# Patient Record
Sex: Male | Born: 1960 | Race: White | Hispanic: No | Marital: Married | Smoking: Current every day smoker
Health system: Southern US, Community
[De-identification: ages and names within clinical notes are randomized; demographics above are authoritative.]

## PROBLEM LIST (undated history)

## (undated) DIAGNOSIS — K219 Gastro-esophageal reflux disease without esophagitis: Secondary | ICD-10-CM

## (undated) DIAGNOSIS — E119 Type 2 diabetes mellitus without complications: Secondary | ICD-10-CM

## (undated) DIAGNOSIS — T7840XA Allergy, unspecified, initial encounter: Secondary | ICD-10-CM

## (undated) DIAGNOSIS — E785 Hyperlipidemia, unspecified: Secondary | ICD-10-CM

## (undated) HISTORY — DX: Type 2 diabetes mellitus without complications: E11.9

## (undated) HISTORY — DX: Gastro-esophageal reflux disease without esophagitis: K21.9

## (undated) HISTORY — DX: Hyperlipidemia, unspecified: E78.5

## (undated) HISTORY — PX: OTHER SURGICAL HISTORY: SHX169

## (undated) HISTORY — DX: Allergy, unspecified, initial encounter: T78.40XA

---

## 2016-03-28 ENCOUNTER — Encounter: Payer: Self-pay | Admitting: Gastroenterology

## 2016-05-07 ENCOUNTER — Ambulatory Visit (AMBULATORY_SURGERY_CENTER): Payer: Self-pay

## 2016-05-07 ENCOUNTER — Encounter: Payer: Self-pay | Admitting: Gastroenterology

## 2016-05-07 VITALS — Ht 68.0 in | Wt 199.6 lb

## 2016-05-07 DIAGNOSIS — Z1211 Encounter for screening for malignant neoplasm of colon: Secondary | ICD-10-CM

## 2016-05-07 MED ORDER — NA SULFATE-K SULFATE-MG SULF 17.5-3.13-1.6 GM/177ML PO SOLN: 1.0000 | Freq: Once | ORAL | 0 refills | Status: AC

## 2016-05-07 NOTE — Progress Notes (Signed)
Denies allergies to eggs or soy products. Denies complication of anesthesia or sedation. Denies use of weight loss medication. Denies use of O2.   Emmi instructions declined by patient.  

## 2016-05-24 ENCOUNTER — Encounter: Payer: Self-pay | Admitting: Gastroenterology

## 2016-05-24 ENCOUNTER — Ambulatory Visit (AMBULATORY_SURGERY_CENTER): Payer: BC Managed Care – PPO | Admitting: Gastroenterology

## 2016-05-24 VITALS — BP 93/58 | HR 69 | Temp 96.6°F | Resp 14 | Ht 68.0 in | Wt 199.0 lb

## 2016-05-24 DIAGNOSIS — D123 Benign neoplasm of transverse colon: Secondary | ICD-10-CM

## 2016-05-24 DIAGNOSIS — Z1211 Encounter for screening for malignant neoplasm of colon: Secondary | ICD-10-CM | POA: Diagnosis present

## 2016-05-24 DIAGNOSIS — Z1212 Encounter for screening for malignant neoplasm of rectum: Secondary | ICD-10-CM

## 2016-05-24 DIAGNOSIS — D125 Benign neoplasm of sigmoid colon: Secondary | ICD-10-CM

## 2016-05-24 DIAGNOSIS — K635 Polyp of colon: Secondary | ICD-10-CM

## 2016-05-24 DIAGNOSIS — D128 Benign neoplasm of rectum: Secondary | ICD-10-CM

## 2016-05-24 DIAGNOSIS — K621 Rectal polyp: Secondary | ICD-10-CM | POA: Diagnosis not present

## 2016-05-24 DIAGNOSIS — D126 Benign neoplasm of colon, unspecified: Secondary | ICD-10-CM | POA: Diagnosis not present

## 2016-05-24 DIAGNOSIS — D129 Benign neoplasm of anus and anal canal: Secondary | ICD-10-CM

## 2016-05-24 DIAGNOSIS — D124 Benign neoplasm of descending colon: Secondary | ICD-10-CM | POA: Diagnosis not present

## 2016-05-24 MED ORDER — SODIUM CHLORIDE 0.9 % IV SOLN: 500.0000 mL | INTRAVENOUS | Status: DC

## 2016-05-24 NOTE — Progress Notes (Signed)
Report given to PACU, vss 

## 2016-05-24 NOTE — Patient Instructions (Signed)
YOU HAD AN ENDOSCOPIC PROCEDURE TODAY AT THE [facility]:   Refer to the procedure report that was given to you for any specific questions about what was found during the examination.  If the procedure report does not answer your questions, please call your gastroenterologist to clarify.  If you requested that your care partner not be given the details of your procedure findings, then the procedure report has been included in a sealed envelope for you to review at your convenience later.  YOU SHOULD EXPECT: Some feelings of bloating in the abdomen. Passage of more gas than usual.  Walking can help get rid of the air that was put into your GI tract during the procedure and reduce the bloating. If you had a lower endoscopy (such as a colonoscopy or flexible sigmoidoscopy) you may notice spotting of blood in your stool or on the toilet paper. If you underwent a bowel prep for your procedure, you may not have a normal bowel movement for a few days.  Please Note:  You might notice some irritation and congestion in your nose or some drainage.  This is from the oxygen used during your procedure.  There is no need for concern and it should clear up in a day or so.  SYMPTOMS TO REPORT IMMEDIATELY:   Following lower endoscopy (colonoscopy or flexible sigmoidoscopy):  Excessive amounts of blood in the stool  Significant tenderness or worsening of abdominal pains  Swelling of the abdomen that is new, acute  Fever of 100F or higher    For urgent or emergent issues, a gastroenterologist can be reached at any hour by calling [phone].   DIET:  We do recommend a small meal at first, but then you may proceed to your regular diet.  Drink plenty of fluids but you should avoid alcoholic beverages for 24 hours.  ACTIVITY:  You should plan to take it easy for the rest of today and you should NOT DRIVE or use heavy machinery until tomorrow (because of the sedation medicines used during the test).     FOLLOW UP: Our staff will call the number listed on your records the next business day following your procedure to check on you and address any questions or concerns that you may have regarding the information given to you following your procedure. If we do not reach you, we will leave a message.  However, if you are feeling well and you are not experiencing any problems, there is no need to return our call.  We will assume that you have returned to your regular daily activities without incident.  If any biopsies were taken you will be contacted by phone or by letter within the next 1-3 weeks.  Please call us at [phone] if you have not heard about the biopsies in 3 weeks.    SIGNATURES/CONFIDENTIALITY: You and/or your care partner have signed paperwork which will be entered into your electronic medical record.  These signatures attest to the fact that that the information above on your After Visit Summary has been reviewed and is understood.  Full responsibility of the confidentiality of this discharge information lies with you and/or your care-partner.   Resume medications. Information given on polyps and hemorrhoids. 

## 2016-05-24 NOTE — Progress Notes (Signed)
Pt's states no medical or surgical changes since previsit or office visit. 

## 2016-05-24 NOTE — Op Note (Signed)
[name] Procedure Date: 05/24/2016 10:30 AM MRN: [id number] Endoscopist: Ladene Artist , MD Age: 56 Referring MD:  Date of Birth: 10/10/1960 Gender: Male Account #: [id number] Procedure:                Colonoscopy Indications:              Screening for colorectal malignant neoplasm, This                            is the patient's first colonoscopy Medicines:                Monitored Anesthesia Care Procedure:                Pre-Anesthesia Assessment:                           - Prior to the procedure, a History and Physical                            was performed, and patient medications and                            allergies were reviewed. The patient's tolerance of                            previous anesthesia was also reviewed. The risks                            and benefits of the procedure and the sedation                            options and risks were discussed with the patient.                            All questions were answered, and informed consent                            was obtained. Prior Anticoagulants: The patient has                            taken no previous anticoagulant or antiplatelet                            agents. ASA Grade Assessment: II - A patient with                            mild systemic disease. After reviewing the risks                            and benefits, the patient was deemed in                            satisfactory condition to undergo the procedure.  After obtaining informed consent, the colonoscope                            was passed under direct vision. Throughout the                            procedure, the patient's blood pressure, pulse, and                            oxygen saturations were monitored continuously. The                            Colonoscope was introduced through the anus and                            advanced to the the cecum,  identified by                            appendiceal orifice and ileocecal valve. The                            ileocecal valve, appendiceal orifice, and rectum                            were photographed. The quality of the bowel                            preparation was adequate after extensive lavage and                            suctioning. The colonoscopy was performed without                            difficulty. The patient tolerated the procedure                            well. Scope In: 10:40:16 AM Scope Out: 11:04:55 AM Scope Withdrawal Time: 0 hours 22 minutes 38 seconds  Total Procedure Duration: 0 hours 24 minutes 39 seconds  Findings:                 The perianal and digital rectal examinations were                            normal.                           A 5 mm polyp was found in the descending colon. The                            polyp was sessile. The polyp was removed with a                            cold biopsy forceps. Resection and retrieval were  complete.                           12 sessile polyps were found in the rectum, sigmoid                            colon and transverse colon. The polyps were 5 to 8                            mm in size. These polyps were removed with a cold                            snare. Resection and retrieval were complete.                           Internal hemorrhoids were found during                            retroflexion. The hemorrhoids were small and Grade                            I (internal hemorrhoids that do not prolapse).                           The exam was otherwise without abnormality on                            direct and retroflexion views. Complications:            No immediate complications. Estimated blood loss:                            None. Estimated Blood Loss:     Estimated blood loss: none. Impression:               - One 5 mm polyp in the descending colon,  removed                            with a cold biopsy forceps. Resected and retrieved.                           - Twelve 5 to 8 mm polyps in the rectum (7), in the                            sigmoid colon (2) and in the transverse colon (3),                            removed with a cold snare. Resected and retrieved.                           - Internal hemorrhoids.                           - The examination was otherwise normal on direct  and retroflexion views. Recommendation:           - Repeat colonoscopy in 2 - 5 years for                            surveillance, pending pathology review, with a more                            extensive bowel prep.                           - Patient has a contact number available for                            emergencies. The signs and symptoms of potential                            delayed complications were discussed with the                            patient. Return to normal activities tomorrow.                            Written discharge instructions were provided to the                            patient.                           - Resume previous diet.                           - Continue present medications.                           - Await pathology results. Ladene Artist, MD 05/24/2016 11:09:49 AM This report has been signed electronically.

## 2016-05-24 NOTE — Progress Notes (Signed)
Called to room to assist during endoscopic procedure.  Patient ID and intended procedure confirmed with present staff. Received instructions for my participation in the procedure from the performing physician.  

## 2016-05-25 ENCOUNTER — Telehealth: Payer: Self-pay

## 2016-05-25 NOTE — Telephone Encounter (Signed)
  Follow up Call-  Call back number 05/24/2016  Post procedure Call Back phone  # [phone]  Permission to leave phone message Yes     Patient questions:  Do you have a fever, pain , or abdominal swelling? No. Pain Score  0 *  Have you tolerated food without any problems? Yes.    Have you been able to return to your normal activities? Yes.    Do you have any questions about your discharge instructions: Diet   No. Medications  No. Follow up visit  No.  Do you have questions or concerns about your Care? No.  Actions: * If pain score is 4 or above: No action needed, pain <4.

## 2016-06-07 ENCOUNTER — Encounter: Payer: Self-pay | Admitting: Gastroenterology

## 2016-12-17 ENCOUNTER — Ambulatory Visit (INDEPENDENT_AMBULATORY_CARE_PROVIDER_SITE_OTHER): Payer: BC Managed Care – PPO | Admitting: Family Medicine

## 2016-12-17 ENCOUNTER — Encounter: Payer: Self-pay | Admitting: Family Medicine

## 2016-12-17 DIAGNOSIS — IMO0001 Reserved for inherently not codable concepts without codable children: Secondary | ICD-10-CM

## 2016-12-17 DIAGNOSIS — E119 Type 2 diabetes mellitus without complications: Secondary | ICD-10-CM | POA: Insufficient documentation

## 2016-12-17 DIAGNOSIS — E1165 Type 2 diabetes mellitus with hyperglycemia: Secondary | ICD-10-CM | POA: Insufficient documentation

## 2016-12-17 MED ORDER — DAPAGLIFLOZIN PROPANEDIOL 10 MG PO TABS
10.0000 mg | ORAL_TABLET | Freq: Every day | ORAL | 3 refills | Status: DC
Start: 2016-12-17 — End: 2017-02-27

## 2016-12-17 NOTE — Progress Notes (Signed)
Pre visit review using our clinic review tool, if applicable. No additional management support is needed unless otherwise documented below in the visit note. 

## 2016-12-17 NOTE — Patient Instructions (Addendum)
Aim to do some physical exertion for 150 minutes per week. This is typically divided into 5 days per week, 30 minutes per day. The activity should be enough to get your heart rate up. Anything is better than nothing if you have time constraints.  Give [place] 2-3 business days to get the results of your labs back. If labs are normal, you will likely receive a letter in the mail unless you have MyChart. This can take longer than 2-3 business days.   Healthy Eating Plan Many factors influence your heart health, including eating and exercise habits. Heart (coronary) risk increases with abnormal blood fat (lipid) levels. Heart-healthy meal planning includes limiting unhealthy fats, increasing healthy fats, and making other small dietary changes. This includes maintaining a healthy body weight to help keep lipid levels within a normal range.  WHAT IS MY PLAN?  Your health care provider recommends that you:  Drink a glass of water before meals to help with satiety.  Eat slowly.  An alternative to the water is to add Metamucil. This will help with satiety as well. It does contain calories, unlike water.  WHAT TYPES OF FAT SHOULD I CHOOSE?  Choose healthy fats more often. Choose monounsaturated and polyunsaturated fats, such as olive oil and canola oil, flaxseeds, walnuts, almonds, and seeds.  Eat more omega-3 fats. Good choices include salmon, mackerel, sardines, tuna, flaxseed oil, and ground flaxseeds. Aim to eat fish at least two times each week.  Avoid foods with partially hydrogenated oils in them. These contain trans fats. Examples of foods that contain trans fats are stick margarine, some tub margarines, cookies, crackers, and other baked goods. If you are going to avoid a fat, this is the one to avoid!  WHAT GENERAL GUIDELINES DO I NEED TO FOLLOW?  Check food labels carefully to identify foods with trans fats. Avoid these types of options when possible.  Fill one half of your plate with  vegetables and green salads. Eat 4-5 servings of vegetables per day. A serving of vegetables equals 1 cup of raw leafy vegetables,  cup of raw or cooked cut-up vegetables, or  cup of vegetable juice.  Fill one fourth of your plate with whole grains. Look for the word "whole" as the first word in the ingredient list.  Fill one fourth of your plate with lean protein foods.  Eat 4-5 servings of fruit per day. A serving of fruit equals one medium whole fruit,  cup of dried fruit,  cup of fresh, frozen, or canned fruit. Try to avoid fruits in cups/syrups as the sugar content can be high.  Eat more foods that contain soluble fiber. Examples of foods that contain this type of fiber are apples, broccoli, carrots, beans, peas, and barley. Aim to get 20-30 g of fiber per day.  Eat more home-cooked food and less restaurant, buffet, and fast food.  Limit or avoid alcohol.  Limit foods that are high in starch and sugar.  Avoid fried foods when able.  Cook foods by using methods other than frying. Baking, boiling, grilling, and broiling are all great options. Other fat-reducing suggestions include: ? Removing the skin from poultry. ? Removing all visible fats from meats. ? Skimming the fat off of stews, soups, and gravies before serving them. ? Steaming vegetables in water or broth.  Lose weight if you are overweight. Losing just 5-10% of your initial body weight can help your overall health and prevent diseases such as diabetes and heart disease.  Increase your  consumption of nuts, legumes, and seeds to 4-5 servings per week. One serving of dried beans or legumes equals  cup after being cooked, one serving of nuts equals 1 ounces, and one serving of seeds equals  ounce or 1 tablespoon.  WHAT ARE GOOD FOODS CAN I EAT? Grains Grainy breads (try to find bread that is 3 g of fiber per slice or greater), oatmeal, light popcorn. Whole-grain cereals. Rice and pasta, including brown rice and those  that are made with whole wheat. Edamame pasta is a great alternative to grain pasta. It has a higher protein content. Try to avoid significant consumption of white bread, sugary cereals, or pastries/baked goods.  Vegetables All vegetables. Cooked white potatoes do not count as vegetables.  Fruits All fruits, but limit pineapple and bananas as these fruits have a higher sugar content.  Meats and Other Protein Sources Lean, well-trimmed beef, veal, pork, and lamb. Chicken and [place] without skin. All fish and shellfish. Wild duck, rabbit, pheasant, and venison. Egg whites or low-cholesterol egg substitutes. Dried beans, peas, lentils, and tofu.Seeds and most nuts.  Dairy Low-fat or nonfat cheeses, including ricotta, string, and mozzarella. Skim or 1% milk that is liquid, powdered, or evaporated. Buttermilk that is made with low-fat milk. Nonfat or low-fat yogurt. Soy/Almond milk are good alternatives if you cannot handle dairy.  Beverages Water is the best for you. Sports drinks with less sugar are more desirable unless you are a highly active athlete.  Sweets and Desserts Sherbets and fruit ices. Honey, jam, marmalade, jelly, and syrups. Dark chocolate.  Eat all sweets and desserts in moderation.  Fats and Oils Nonhydrogenated (trans-free) margarines. Vegetable oils, including soybean, sesame, sunflower, olive, peanut, safflower, corn, canola, and cottonseed. Salad dressings or mayonnaise that are made with a vegetable oil. Limit added fats and oils that you use for cooking, baking, salads, and as spreads.  Other Cocoa powder. Coffee and tea. Most condiments.  The items listed above may not be a complete list of recommended foods or beverages. Contact your dietitian for more options.

## 2016-12-17 NOTE — Progress Notes (Signed)
Chief Complaint  Patient presents with  . Establish Care       New Patient Visit SUBJECTIVE: HPI: Dennis Stanton is an 56 y.o.male who is being seen for establishing care.  The patient was previously seen at [facility]. Here with wife.   Colonoscopy-2018 Pneumonia vaccine-2018 Flu shot-1 week ago Eye exam-[facility]/03/[facility] Microalbumin/creatinine ratio- 08/31/16 Last A1c was 7.2-7/13/18  He checks his sugar around 2 times per week.  Readings are low 100s fasting.  He is currently on Farxiga 5 mg daily, Januvia 100 mg daily, he does not require insulin.  He denies hypoglycemia.  He is physically active at work, however he dedicates no time to exercise.  His diet is fair.  He is on Lipitor 20 mg daily.  He is also on lisinopril.  No Known Allergies  Past Medical History:  Diagnosis Date  . Allergy   . Diabetes mellitus without complication ([place])   . GERD (gastroesophageal reflux disease)   . Hyperlipidemia    Past Surgical History:  Procedure Laterality Date  . broken elbow Right    Social History   Social History  . Marital status: Married   Social History Main Topics  . Smoking status: Current Every Day Smoker    Packs/day: 0.50    Types: Cigarettes  . Smokeless tobacco: Never Used  . Alcohol use Yes     Comment: occasional  . Drug use: No   Family History  Problem Relation Age of Onset  . Bladder Cancer Father   . Diabetes Mother   . Heart disease Mother   . Hypertension Mother   . Diabetes Sister   . Colon cancer Neg Hx   . Esophageal cancer Neg Hx   . Rectal cancer Neg Hx   . Stomach cancer Neg Hx      Current Outpatient Prescriptions:  .  aspirin 81 MG chewable tablet, Chew 1 mg by mouth daily., Disp: , Rfl:  .  atorvastatin (LIPITOR) 20 MG tablet, Take 20 mg by mouth daily., Disp: , Rfl:  .  dapagliflozin propanediol (FARXIGA) 5 MG TABS tablet, Take 5 mg by mouth daily., Disp: , Rfl:  .  glucose blood (ONE TOUCH ULTRA TEST) test strip,  Test as directed three times daily, Disp: , Rfl:  .  lisinopril (PRINIVIL,ZESTRIL) 10 MG tablet, Take 10 mg by mouth daily., Disp: , Rfl:  .  metFORMIN (GLUCOPHAGE) 1000 MG tablet, Take 1,000 mg by mouth 2 (two) times daily with a meal., Disp: , Rfl:  .  Omega-3 Fatty Acids (FISH OIL) 1000 MG CAPS, Take 1 capsule by mouth daily., Disp: , Rfl:  .  sitaGLIPtin (JANUVIA) 100 MG tablet, Take 100 mg by mouth daily., Disp: , Rfl:   ROS Cardiovascular: Denies chest pain  Respiratory: Denies dyspnea   OBJECTIVE: BP 108/62 (BP Location: Left Arm, Patient Position: Sitting, Cuff Size: Normal)   Pulse 88   Temp 98.4 F (36.9 C) (Oral)   Ht 5\' 8"  (1.727 m)   Wt 197 lb (89.4 kg)   SpO2 97%   BMI 29.95 kg/m   Constitutional: -  VS reviewed -  Well developed, well nourished, appears stated age -  No apparent distress  Psychiatric: -  Oriented to person, place, and time -  Memory intact -  Affect and mood normal -  Fluent conversation, good eye contact -  Judgment and insight age appropriate  Eye: -  Conjunctivae clear, no discharge -  Pupils symmetric, round, reactive  to light  ENMT: -  MMM    Pharynx moist, no exudate, no erythema  Neck: -  No gross swelling, no palpable masses -  Thyroid midline, not enlarged, mobile, no palpable masses  Cardiovascular: -  RRR -  No LE edema  Respiratory: -  Normal respiratory effort, no accessory muscle use, no retraction -  Breath sounds equal, no wheezes, no ronchi, no crackles  Gastrointestinal: -  Bowel sounds normal -  No tenderness, no distention, no guarding, no masses  Neurological:  -  CN II - XII grossly intact -  Sensation grossly intact to light touch, equal bilaterally  Musculoskeletal: -  No clubbing, no cyanosis -  Gait normal  Skin: -  No significant lesion on inspection -  Warm and dry to palpation   ASSESSMENT/PLAN: Uncontrolled diabetes mellitus type 2 without complications (HCC) - Plan: Hemoglobin A1c, dapagliflozin  propanediol (FARXIGA) 10 MG TABS tablet  Patient instructed to sign release of records form from his previous PCP. Counseled on diet and exercise.  Healthy diet handout given.  Due to cost, I will stop Januvia.  Fortunately for discontinuation, this medicine lacks mortality benefit data.  We will increase the dose of the first week.  Continue metformin. Patient should return 3 mo. The patient voiced understanding and agreement to the plan.   [place], DO 12/17/16  4:10 PM

## 2016-12-21 LAB — HEMOGLOBIN A1C: HEMOGLOBIN A1C: 7 % — AB (ref 4.6–6.5)

## 2017-01-17 ENCOUNTER — Telehealth: Payer: Self-pay | Admitting: *Deleted

## 2017-02-27 ENCOUNTER — Telehealth: Payer: Self-pay | Admitting: Family Medicine

## 2017-02-27 DIAGNOSIS — E1165 Type 2 diabetes mellitus with hyperglycemia: Principal | ICD-10-CM

## 2017-02-27 DIAGNOSIS — IMO0001 Reserved for inherently not codable concepts without codable children: Secondary | ICD-10-CM

## 2017-02-27 MED ORDER — DAPAGLIFLOZIN PROPANEDIOL 10 MG PO TABS: 10.0000 mg | ORAL_TABLET | Freq: Every day | ORAL | 1 refills | Status: DC

## 2017-02-27 MED ORDER — LISINOPRIL 10 MG PO TABS: 10.0000 mg | ORAL_TABLET | Freq: Every day | ORAL | 1 refills | Status: DC

## 2017-02-27 MED ORDER — METFORMIN HCL 1000 MG PO TABS: 1000.0000 mg | ORAL_TABLET | Freq: Two times a day (BID) | ORAL | 1 refills | Status: DC

## 2017-02-27 NOTE — Telephone Encounter (Signed)
Copied from [place] [id number]. Topic: Quick Communication - See Telephone Encounter >> Feb 27, 2017 10:46 AM Conception Chancy, NT wrote: CRM for notification. See Telephone encounter for:  02/27/17.  Patient is requesting a refill on metformin 1000mg , lisinopril 10mg , atorvastatin 20mg , farxiga 10mg  . All 90 day supply be sent into [facility] on file.

## 2017-02-28 ENCOUNTER — Telehealth: Payer: Self-pay | Admitting: Family Medicine

## 2017-02-28 NOTE — Telephone Encounter (Signed)
Copied from [place] [id number]. Topic: Inquiry >> Feb 28, 2017  5:08 PM Cecelia Byars, NT wrote: Reason for QTT:[id number] wife and called and said the pharmacy called and said that the prescription for atorvastin 20 mg will not be filled until the patient is seen , he has an appointment on 03/21/17 ,he would also like this to be filled for 90 days please advise  [phone]

## 2017-03-01 MED ORDER — ATORVASTATIN CALCIUM 20 MG PO TABS: 20.0000 mg | ORAL_TABLET | Freq: Every day | ORAL | 0 refills | Status: DC

## 2017-03-21 ENCOUNTER — Ambulatory Visit: Payer: BC Managed Care – PPO | Admitting: Family Medicine

## 2017-04-08 ENCOUNTER — Ambulatory Visit: Payer: BC Managed Care – PPO | Admitting: Family Medicine

## 2017-04-08 DIAGNOSIS — Z0289 Encounter for other administrative examinations: Secondary | ICD-10-CM

## 2017-05-30 ENCOUNTER — Other Ambulatory Visit: Payer: Self-pay | Admitting: Family Medicine

## 2017-08-23 ENCOUNTER — Ambulatory Visit: Payer: BC Managed Care – PPO | Admitting: Family Medicine

## 2017-08-23 ENCOUNTER — Encounter: Payer: Self-pay | Admitting: Family Medicine

## 2017-08-23 VITALS — BP 120/78 | HR 78 | Temp 97.7°F | Resp 98 | Ht 68.0 in | Wt 182.0 lb

## 2017-08-23 DIAGNOSIS — Z114 Encounter for screening for human immunodeficiency virus [HIV]: Secondary | ICD-10-CM

## 2017-08-23 DIAGNOSIS — E785 Hyperlipidemia, unspecified: Secondary | ICD-10-CM | POA: Diagnosis not present

## 2017-08-23 DIAGNOSIS — Z125 Encounter for screening for malignant neoplasm of prostate: Secondary | ICD-10-CM | POA: Diagnosis not present

## 2017-08-23 DIAGNOSIS — Z1159 Encounter for screening for other viral diseases: Secondary | ICD-10-CM

## 2017-08-23 DIAGNOSIS — IMO0001 Reserved for inherently not codable concepts without codable children: Secondary | ICD-10-CM

## 2017-08-23 DIAGNOSIS — E1165 Type 2 diabetes mellitus with hyperglycemia: Secondary | ICD-10-CM

## 2017-08-23 LAB — COMPREHENSIVE METABOLIC PANEL
ALK PHOS: 53 U/L (ref 39–117)
ALT: 26 U/L (ref 0–53)
AST: 17 U/L (ref 0–37)
Albumin: 4.7 g/dL (ref 3.5–5.2)
BILIRUBIN TOTAL: 0.8 mg/dL (ref 0.2–1.2)
BUN: 16 mg/dL (ref 6–23)
CO2: 27 meq/L (ref 19–32)
Calcium: 9.8 mg/dL (ref 8.4–10.5)
Chloride: 102 mEq/L (ref 96–112)
Creatinine, Ser: 0.74 mg/dL (ref 0.40–1.50)
GFR: 115.1 mL/min (ref >60.00)
GLUCOSE: 171 mg/dL — AB (ref 70–99)
Potassium: 4.3 mEq/L (ref 3.5–5.1)
SODIUM: 139 meq/L (ref 135–145)
TOTAL PROTEIN: 7.1 g/dL (ref 6.0–8.3)

## 2017-08-23 LAB — LIPID PANEL
CHOLESTEROL: 97 mg/dL (ref 0–200)
HDL: 34.9 mg/dL — ABNORMAL LOW (ref >39.00)
LDL Cholesterol: 47 mg/dL (ref 0–99)
NONHDL: 62.29
Total CHOL/HDL Ratio: 3
Triglycerides: 77 mg/dL (ref 0.0–149.0)
VLDL: 15.4 mg/dL (ref 0.0–40.0)

## 2017-08-23 LAB — PSA: PSA: 0.09 ng/mL — ABNORMAL LOW (ref 0.10–4.00)

## 2017-08-23 LAB — HEMOGLOBIN A1C: HEMOGLOBIN A1C: 7.6 % — AB (ref 4.6–6.5)

## 2017-08-23 MED ORDER — ATORVASTATIN CALCIUM 20 MG PO TABS: 20.0000 mg | ORAL_TABLET | Freq: Every day | ORAL | 3 refills | Status: DC

## 2017-08-23 MED ORDER — METFORMIN HCL 1000 MG PO TABS: 1000.0000 mg | ORAL_TABLET | Freq: Two times a day (BID) | ORAL | 1 refills | Status: DC

## 2017-08-23 MED ORDER — LISINOPRIL 10 MG PO TABS: 10.0000 mg | ORAL_TABLET | Freq: Every day | ORAL | 1 refills | Status: DC

## 2017-08-23 MED ORDER — DAPAGLIFLOZIN PROPANEDIOL 10 MG PO TABS: 10.0000 mg | ORAL_TABLET | Freq: Every day | ORAL | 1 refills | Status: DC

## 2017-08-23 NOTE — Progress Notes (Signed)
Pre visit review using our clinic review tool, if applicable. No additional management support is needed unless otherwise documented below in the visit note. 

## 2017-08-23 NOTE — Patient Instructions (Addendum)
Call your pharmacy to see about the availability of the new shingles vaccine (Shingrix).  Keep the diet clean.  Stay physically active.   Have your eye provider send [place] your report please.  Let us know if you need anything.

## 2017-08-23 NOTE — Progress Notes (Signed)
Subjective:   Chief Complaint  Patient presents with  . Follow-up    diabetes    Dennis Stanton is a 57 y.o. male here for follow-up of diabetes.   Dennis Stanton's self monitored glucose range is low 100's.  Patient denies hypoglycemic reactions. He checks his glucose levels 2 times per day. Patient does not require insulin.   Medications include: Farxiga 10 mg/d and Metformin 1000 mg bid Reports diet is healthy overall. Patient exercises 7 days per week on average.  He will walk or do work in his garden. Eye exam is coming up.   Hyperlipidemia Patient presents for dyslipidemia follow up. Currently being treated with Lipitor 20 mg/d and compliance with treatment thus far has been good. He denies myalgias. He is adhering to a healthy. The patient exercises daily.  The patient is not known to have coexisting coronary artery disease.   Past Medical History:  Diagnosis Date  . Allergy   . Diabetes mellitus without complication ([place])   . GERD (gastroesophageal reflux disease)   . Hyperlipidemia     Past Surgical History:  Procedure Laterality Date  . broken elbow Right     Family History  Problem Relation Age of Onset  . Bladder Cancer Father   . Diabetes Mother   . Heart disease Mother   . Hypertension Mother   . Diabetes Sister   . Colon cancer Neg Hx   . Esophageal cancer Neg Hx   . Rectal cancer Neg Hx   . Stomach cancer Neg Hx    Current Outpatient Medications on File Prior to Visit  Medication Sig Dispense Refill  . aspirin 81 MG chewable tablet Chew 1 mg by mouth daily.    Marland Kitchen atorvastatin (LIPITOR) 20 MG tablet TAKE 1 TABLET BY MOUTH ONCE DAILY 90 tablet 0  . dapagliflozin propanediol (FARXIGA) 10 MG TABS tablet Take 10 mg by mouth daily. 90 tablet 1  . glucose blood (ONE TOUCH ULTRA TEST) test strip Test as directed three times daily    . lisinopril (PRINIVIL,ZESTRIL) 10 MG tablet Take 1 tablet (10 mg total) by mouth daily. 90 tablet 1  . metFORMIN (GLUCOPHAGE)  1000 MG tablet Take 1 tablet (1,000 mg total) by mouth 2 (two) times daily with a meal. 180 tablet 1  . Omega-3 Fatty Acids (FISH OIL) 1000 MG CAPS Take 1 capsule by mouth daily.      Related testing: Date of retinal exam: 08/2016 Done by:  Dr. Bing Plume Pneumovax: done 2 years ago Tetanus- 2 years  Review of Systems: Pulmonary:  No SOB Cardiovascular:  No chest pain  Objective:  BP 120/78 (BP Location: Left Arm, Patient Position: Sitting, Cuff Size: Normal)   Pulse 78   Temp 97.7 F (36.5 C) (Oral)   Resp (!) 98   Ht 5\' 8"  (1.727 m)   Wt 182 lb (82.6 kg)   BMI 27.67 kg/m  General:  Well developed, well nourished, in no apparent distress Skin:  Warm, no pallor or diaphoresis Head:  Normocephalic, atraumatic Eyes:  Pupils equal and round, sclera anicteric without injection  Lungs:  CTAB, no access msc use Cardio:  RRR, no bruits, no LE edema Musculoskeletal:  Symmetrical muscle groups noted without atrophy or deformity Neuro:  Sensation intact to pinprick on feet Psych: Age appropriate judgment and insight  Assessment:   Uncontrolled diabetes mellitus type 2 without complications (HCC) - Plan: Hemoglobin A1c, Comprehensive metabolic panel, dapagliflozin propanediol (FARXIGA) 10 MG TABS tablet, HM Diabetes Foot Exam  Hyperlipidemia,  unspecified hyperlipidemia type - Plan: Lipid panel  Screening for prostate cancer - Plan: PSA  Screening for HIV (human immunodeficiency virus) - Plan: HIV antibody  Encounter for hepatitis C screening test for low risk patient - Plan: Hepatitis C antibody   Plan:   Orders as above. Counseled on diet and exercise. Cont above meds for now.  Counseled on risks and benefits of prostate cancer screening with PSA, he elected to undergo screening. F/u in 3 mo. The patient voiced understanding and agreement to the plan.  [place], DO 08/23/17 12:46 PM

## 2017-08-24 LAB — HEPATITIS C ANTIBODY
Hepatitis C Ab: NONREACTIVE
SIGNAL TO CUT-OFF: 0.01 (ref <1.00)

## 2017-08-24 LAB — HIV ANTIBODY (ROUTINE TESTING W REFLEX): HIV: NONREACTIVE

## 2017-08-27 ENCOUNTER — Other Ambulatory Visit: Payer: Self-pay | Admitting: Family Medicine

## 2017-08-27 MED ORDER — PIOGLITAZONE HCL 30 MG PO TABS: 30.0000 mg | ORAL_TABLET | Freq: Every day | ORAL | 3 refills | Status: DC

## 2017-12-22 ENCOUNTER — Other Ambulatory Visit: Payer: Self-pay | Admitting: Family Medicine

## 2018-02-21 ENCOUNTER — Other Ambulatory Visit: Payer: Self-pay | Admitting: Family Medicine

## 2018-05-18 ENCOUNTER — Other Ambulatory Visit: Payer: Self-pay | Admitting: Family Medicine

## 2018-07-06 ENCOUNTER — Other Ambulatory Visit: Payer: Self-pay | Admitting: Family Medicine

## 2018-07-06 DIAGNOSIS — IMO0001 Reserved for inherently not codable concepts without codable children: Secondary | ICD-10-CM

## 2018-07-27 ENCOUNTER — Other Ambulatory Visit: Payer: Self-pay | Admitting: Family Medicine

## 2018-08-18 ENCOUNTER — Other Ambulatory Visit: Payer: Self-pay | Admitting: Family Medicine

## 2018-09-28 ENCOUNTER — Other Ambulatory Visit: Payer: Self-pay | Admitting: Family Medicine

## 2018-09-28 DIAGNOSIS — IMO0001 Reserved for inherently not codable concepts without codable children: Secondary | ICD-10-CM

## 2018-11-17 ENCOUNTER — Other Ambulatory Visit: Payer: Self-pay | Admitting: Family Medicine

## 2018-12-15 ENCOUNTER — Other Ambulatory Visit: Payer: Self-pay | Admitting: Family Medicine

## 2018-12-18 ENCOUNTER — Other Ambulatory Visit: Payer: Self-pay

## 2018-12-19 ENCOUNTER — Encounter: Payer: Self-pay | Admitting: Family Medicine

## 2018-12-19 ENCOUNTER — Other Ambulatory Visit: Payer: Self-pay | Admitting: *Deleted

## 2018-12-19 ENCOUNTER — Ambulatory Visit: Payer: BC Managed Care – PPO | Admitting: Family Medicine

## 2018-12-19 VITALS — BP 104/62 | HR 76 | Temp 96.1°F | Ht 69.0 in | Wt 187.0 lb

## 2018-12-19 DIAGNOSIS — E785 Hyperlipidemia, unspecified: Secondary | ICD-10-CM

## 2018-12-19 DIAGNOSIS — I10 Essential (primary) hypertension: Secondary | ICD-10-CM | POA: Diagnosis not present

## 2018-12-19 DIAGNOSIS — E1165 Type 2 diabetes mellitus with hyperglycemia: Secondary | ICD-10-CM

## 2018-12-19 DIAGNOSIS — E119 Type 2 diabetes mellitus without complications: Secondary | ICD-10-CM

## 2018-12-19 LAB — MICROALBUMIN / CREATININE URINE RATIO
Creatinine,U: 41.6 mg/dL
Microalb Creat Ratio: 1.7 mg/g (ref 0.0–30.0)
Microalb, Ur: <0.7 mg/dL (ref 0.0–1.9)

## 2018-12-19 LAB — LIPID PANEL
Cholesterol: 125 mg/dL (ref 0–200)
HDL: 37.8 mg/dL — ABNORMAL LOW (ref >39.00)
LDL Cholesterol: 67 mg/dL (ref 0–99)
NonHDL: 87.12
Total CHOL/HDL Ratio: 3
Triglycerides: 102 mg/dL (ref 0.0–149.0)
VLDL: 20.4 mg/dL (ref 0.0–40.0)

## 2018-12-19 LAB — COMPREHENSIVE METABOLIC PANEL
ALT: 28 U/L (ref 0–53)
AST: 17 U/L (ref 0–37)
Albumin: 4.6 g/dL (ref 3.5–5.2)
Alkaline Phosphatase: 60 U/L (ref 39–117)
BUN: 18 mg/dL (ref 6–23)
CO2: 27 mEq/L (ref 19–32)
Calcium: 9.8 mg/dL (ref 8.4–10.5)
Chloride: 102 mEq/L (ref 96–112)
Creatinine, Ser: 0.75 mg/dL (ref 0.40–1.50)
GFR: 106.15 mL/min (ref >60.00)
Glucose, Bld: 186 mg/dL — ABNORMAL HIGH (ref 70–99)
Potassium: 4.3 mEq/L (ref 3.5–5.1)
Sodium: 138 mEq/L (ref 135–145)
Total Bilirubin: 0.7 mg/dL (ref 0.2–1.2)
Total Protein: 7.1 g/dL (ref 6.0–8.3)

## 2018-12-19 LAB — CBC
HCT: 47.5 % (ref 39.0–52.0)
Hemoglobin: 16.3 g/dL (ref 13.0–17.0)
MCHC: 34.4 g/dL (ref 30.0–36.0)
MCV: 93.2 fl (ref 78.0–100.0)
Platelets: 124 10*3/uL — ABNORMAL LOW (ref 150.0–400.0)
RBC: 5.09 Mil/uL (ref 4.22–5.81)
RDW: 13.6 % (ref 11.5–15.5)
WBC: 7.4 10*3/uL (ref 4.0–10.5)

## 2018-12-19 LAB — HEMOGLOBIN A1C: Hgb A1c MFr Bld: 8.3 % — ABNORMAL HIGH (ref 4.6–6.5)

## 2018-12-19 MED ORDER — METFORMIN HCL 1000 MG PO TABS: ORAL_TABLET | ORAL | 0 refills | Status: DC

## 2018-12-19 MED ORDER — ATORVASTATIN CALCIUM 20 MG PO TABS: 20.0000 mg | ORAL_TABLET | Freq: Every day | ORAL | 1 refills | Status: DC

## 2018-12-19 MED ORDER — LISINOPRIL 10 MG PO TABS: ORAL_TABLET | ORAL | 1 refills | Status: DC

## 2018-12-19 MED ORDER — PIOGLITAZONE HCL 30 MG PO TABS: ORAL_TABLET | ORAL | 1 refills | Status: DC

## 2018-12-19 MED ORDER — FARXIGA 10 MG PO TABS: 10.0000 mg | ORAL_TABLET | Freq: Every day | ORAL | 1 refills | Status: DC

## 2018-12-19 NOTE — Patient Instructions (Signed)
Give [place] 2-3 business days to get the results of your labs back.   Keep the diet clean and stay active.  Aim to do some physical exertion for 150 minutes per week. This is typically divided into 5 days per week, 30 minutes per day. The activity should be enough to get your heart rate up. Anything is better than nothing if you have time constraints.  Call your eye provider.   Let us know if you need anything.

## 2018-12-19 NOTE — Progress Notes (Signed)
Subjective:   Chief Complaint  Patient presents with  . Follow-up    Diabetes    Dennis Stanton is a 58 y.o. male here for follow-up of diabetes.   Dennis Stanton's self monitored glucose range is low 100's.  Patient denies hypoglycemic reactions. He checks his glucose levels 1-2 times per day. Patient does not require insulin.   Medications include: Metformin 1000 mg bid, Farxiga 10 mg/d, Actos 30 mg/d Diet is good.  Exercise: walking  Hypertension Patient presents for hypertension follow up. He does not usually monitor home blood pressures. He is compliant with medication- lisinopril 10 mg/d. Patient has these side effects of medication: none He is usually adhering to a healthy diet overall. Exercise: walking  Hyperlipidemia Patient presents for dyslipidemia follow up. Currently being treated with Lipitor 20 mg/d and compliance with treatment thus far has been good. He denies myalgias. He is adhering to a healthy diet. Exercise: walking The patient is not known to have coexisting coronary artery disease.  Past Medical History:  Diagnosis Date  . Allergy   . Diabetes mellitus without complication ([place])   . GERD (gastroesophageal reflux disease)   . Hyperlipidemia      Related testing: Date of retinal exam: Due Pneumovax: done Flu Shot: done  Review of Systems: Pulmonary:  No SOB Cardiovascular:  No chest pain  Objective:  BP 104/62 (BP Location: Left Arm, Patient Position: Sitting, Cuff Size: Normal)   Pulse 76   Temp (!) 96.1 F (35.6 C) (Temporal)   Ht 5\' 9"  (1.753 m)   Wt 187 lb (84.8 kg)   SpO2 97%   BMI 27.62 kg/m  General:  Well developed, well nourished, in no apparent distress Skin:  Warm, no pallor or diaphoresis Head:  Normocephalic, atraumatic Eyes:  Pupils equal and round, sclera anicteric without injection  Lungs:  CTAB, no access msc use Cardio:  RRR, no bruits, no LE edema Musculoskeletal:  Symmetrical muscle groups noted without atrophy or  deformity Neuro:  Sensation intact to pinprick on feet Psych: Age appropriate judgment and insight  Assessment:   Uncontrolled type 2 diabetes mellitus with hyperglycemia (HCC) - Plan: CBC, Comprehensive metabolic panel, Lipid panel, Hemoglobin A1c, Microalbumin / creatinine urine ratio, HM DIABETES FOOT EXAM  Essential hypertension  Hyperlipidemia, unspecified hyperlipidemia type - Plan: Lipid panel   Plan:   Orders as above. Counseled on diet and exercise. F/u in 6 mo for CPE. The patient voiced understanding and agreement to the plan.  [place], DO 12/19/18 10:14 AM

## 2019-03-20 ENCOUNTER — Ambulatory Visit: Payer: BC Managed Care – PPO | Admitting: Family Medicine

## 2019-03-24 ENCOUNTER — Ambulatory Visit (INDEPENDENT_AMBULATORY_CARE_PROVIDER_SITE_OTHER): Payer: BC Managed Care – PPO | Admitting: Family Medicine

## 2019-03-24 ENCOUNTER — Encounter: Payer: Self-pay | Admitting: Family Medicine

## 2019-03-24 ENCOUNTER — Other Ambulatory Visit: Payer: Self-pay

## 2019-03-24 DIAGNOSIS — E119 Type 2 diabetes mellitus without complications: Secondary | ICD-10-CM

## 2019-03-24 MED ORDER — FARXIGA 10 MG PO TABS: 10.0000 mg | ORAL_TABLET | Freq: Every day | ORAL | 2 refills | Status: DC

## 2019-03-24 MED ORDER — PIOGLITAZONE HCL 30 MG PO TABS: ORAL_TABLET | ORAL | 2 refills | Status: DC

## 2019-03-24 MED ORDER — ATORVASTATIN CALCIUM 20 MG PO TABS: 20.0000 mg | ORAL_TABLET | Freq: Every day | ORAL | 2 refills | Status: DC

## 2019-03-24 MED ORDER — METFORMIN HCL 1000 MG PO TABS: ORAL_TABLET | ORAL | 2 refills | Status: DC

## 2019-03-24 MED ORDER — LISINOPRIL 10 MG PO TABS: ORAL_TABLET | ORAL | 2 refills | Status: DC

## 2019-03-24 NOTE — Addendum Note (Signed)
Addended by: Ames Coupe on: 03/24/2019 02:01 PM   Modules accepted: Orders, Level of Service

## 2019-03-24 NOTE — Progress Notes (Addendum)
Subjective:   Chief Complaint  Patient presents with  . Follow-up    3 month and do labs    Dennis Stanton is a 59 y.o. male here for follow-up of diabetes.  Due to COVID-19 pandemic, we are interacting via telephone. I verified patient's ID using 2 identifiers. Patient agreed to proceed with visit via this method. Patient is at home, I am at office. Patient and I are present for visit.  [name] self monitored glucose range is low 100's.  No hypoglycemia.  Patient does not require insulin.   Medications include: Metformin 1000 mg bid, Actos 30 mg/d, Farxiga 10 mg/d Diet is fair.  Exercise: none  Past Medical History:  Diagnosis Date  . Allergy   . Diabetes mellitus without complication (Dennis Stanton)   . GERD (gastroesophageal reflux disease)   . Hyperlipidemia      Related testing: Date of retinal exam: Due Pneumovax: done Flu Shot: done  Review of Systems: Pulmonary:  No SOB Cardiovascular:  No chest pain  Objective:  No conversational dyspnea Age appropriate judgment and insight Nml affect and mood  Assessment:   Diabetes mellitus without complication (Dennis Stanton) - Plan: dapagliflozin propanediol (FARXIGA) 10 MG TABS tablet   Plan:   Orders as above. Counseled on diet and exercise. F/u in 3 mo. Total time: 12 min The patient voiced understanding and agreement to the plan.  [place], DO 03/24/19 1:58 PM

## 2019-05-19 ENCOUNTER — Other Ambulatory Visit: Payer: Self-pay | Admitting: Family Medicine

## 2019-08-17 ENCOUNTER — Other Ambulatory Visit: Payer: Self-pay | Admitting: Family Medicine

## 2019-09-21 ENCOUNTER — Ambulatory Visit: Payer: BC Managed Care – PPO | Admitting: Family Medicine

## 2019-09-21 ENCOUNTER — Other Ambulatory Visit: Payer: Self-pay

## 2019-09-21 ENCOUNTER — Encounter: Payer: Self-pay | Admitting: Family Medicine

## 2019-09-21 VITALS — BP 110/70 | HR 79 | Temp 98.1°F | Ht 69.0 in | Wt 174.0 lb

## 2019-09-21 DIAGNOSIS — I10 Essential (primary) hypertension: Secondary | ICD-10-CM

## 2019-09-21 DIAGNOSIS — E1165 Type 2 diabetes mellitus with hyperglycemia: Secondary | ICD-10-CM | POA: Diagnosis not present

## 2019-09-21 DIAGNOSIS — Z720 Tobacco use: Secondary | ICD-10-CM | POA: Diagnosis not present

## 2019-09-21 LAB — COMPREHENSIVE METABOLIC PANEL WITH GFR
ALT: 20 U/L (ref 0–53)
AST: 15 U/L (ref 0–37)
Albumin: 4.5 g/dL (ref 3.5–5.2)
Alkaline Phosphatase: 59 U/L (ref 39–117)
BUN: 18 mg/dL (ref 6–23)
CO2: 27 meq/L (ref 19–32)
Calcium: 9.8 mg/dL (ref 8.4–10.5)
Chloride: 101 meq/L (ref 96–112)
Creatinine, Ser: 0.81 mg/dL (ref 0.40–1.50)
GFR: 96.88 mL/min
Glucose, Bld: 185 mg/dL — ABNORMAL HIGH (ref 70–99)
Potassium: 4 meq/L (ref 3.5–5.1)
Sodium: 136 meq/L (ref 135–145)
Total Bilirubin: 0.8 mg/dL (ref 0.2–1.2)
Total Protein: 7.1 g/dL (ref 6.0–8.3)

## 2019-09-21 LAB — LIPID PANEL
Cholesterol: 103 mg/dL (ref 0–200)
HDL: 41.2 mg/dL
LDL Cholesterol: 46 mg/dL (ref 0–99)
NonHDL: 61.63
Total CHOL/HDL Ratio: 2
Triglycerides: 77 mg/dL (ref 0.0–149.0)
VLDL: 15.4 mg/dL (ref 0.0–40.0)

## 2019-09-21 LAB — MICROALBUMIN / CREATININE URINE RATIO
Creatinine,U: 64.4 mg/dL
Microalb Creat Ratio: 1.1 mg/g (ref 0.0–30.0)
Microalb, Ur: <0.7 mg/dL (ref 0.0–1.9)

## 2019-09-21 LAB — HEMOGLOBIN A1C: Hgb A1c MFr Bld: 9 % — ABNORMAL HIGH (ref 4.6–6.5)

## 2019-09-21 MED ORDER — VARENICLINE TARTRATE 0.5 MG X 11 & 1 MG X 42 PO MISC: ORAL | 0 refills | Status: DC

## 2019-09-21 NOTE — Progress Notes (Signed)
Subjective:   Chief Complaint  Patient presents with  . Follow-up    Dennis Stanton is a 59 y.o. male here for follow-up of diabetes.   Marvion's self monitored glucose range is low 100's. Patient denies hypoglycemic reactions. He checks his glucose levels 1-2 times per week.  Patient does not require insulin.   Medications include: Actos 30 mg/d, Farxiga, metformin 1000 mg bid Diet is healthy overall.  Exercise: none  Hypertension Patient presents for hypertension follow up. He does not monitor home blood pressures. He is compliant with medications- lisinopril 10 mg/d. Patient has these side effects of medication: none Diet/exercise as above.   Pt interested in smoking cessation. Had quit in past, started again. He is no longer working and would like to try to stop again. Interested in [place].   Past Medical History:  Diagnosis Date  . Allergy   . Diabetes mellitus without complication ([name])   . GERD (gastroesophageal reflux disease)   . Hyperlipidemia      Related testing: Date of retinal exam: Due Pneumovax: done  Objective:  BP 110/70 (BP Location: Left Arm, Patient Position: Sitting, Cuff Size: Normal)   Pulse 79   Temp 98.1 F (36.7 C) (Oral)   Ht 5\' 9"  (1.753 m)   Wt 174 lb (78.9 kg)   SpO2 97%   BMI 25.70 kg/m  General:  Well developed, well nourished, in no apparent distress Skin:  Warm, no pallor or diaphoresis Head:  Normocephalic, atraumatic Eyes:  Pupils equal and round, sclera anicteric without injection  Lungs:  CTAB, no access msc use Cardio:  RRR, no bruits, no LE edema Musculoskeletal:  Symmetrical muscle groups noted without atrophy or deformity Neuro:  Sensation intact to pinprick on feet Psych: Age appropriate judgment and insight  Assessment:   Uncontrolled type 2 diabetes mellitus with hyperglycemia (HCC) - Plan: Comprehensive metabolic panel, Microalbumin / creatinine urine ratio, Hemoglobin A1c, Lipid panel  Essential  hypertension  Tobacco abuse - Plan: varenicline (CHANTIX PAK) 0.5 MG X 11 & 1 MG X 42 tablet   Plan:   1. Ck sugars throughout day, not same time. Counseled on diet and exercise. F/u on labs. Needs to sched eye exam. 2. Cont meds. 3. Chantix. Counseled on cessation.  F/u in 3-6 mo. The patient voiced understanding and agreement to the plan.  [place], DO 09/21/19 11:18 AM

## 2019-09-21 NOTE — Patient Instructions (Addendum)
Check your sugars around 2-3 times per week. Alternate checking in the morning before you eat, in the afternoon and before bed. Write them down and bring it to your next appointment.   Give [place] 2-3 business days to get the results of your labs back.   Keep the diet clean and stay active.  Please call your eye provider.   Let us know if you need anything. Coping with Quitting Smoking  Quitting smoking is a physical and mental challenge. You will face cravings, withdrawal symptoms, and temptation. Before quitting, work with your health care provider to make a plan that can help you cope. Preparation can help you quit and keep you from giving in. How can I cope with cravings? Cravings usually last for 5-10 minutes. If you get through it, the craving will pass. Consider taking the following actions to help you cope with cravings:  Keep your mouth busy: ? Chew sugar-free gum. ? Suck on hard candies or a straw. ? Brush your teeth.  Keep your hands and body busy: ? Immediately change to a different activity when you feel a craving. ? Squeeze or play with a ball. ? Do an activity or a hobby, like making bead jewelry, practicing needlepoint, or working with wood. ? Mix up your normal routine. ? Take a short exercise break. Go for a quick walk or run up and down stairs. ? Spend time in public places where smoking is not allowed.  Focus on doing something kind or helpful for someone else.  Call a friend or family member to talk during a craving.  Join a support group.  Call a quit line, such as 1-800-QUIT-NOW.  Talk with your health care provider about medicines that might help you cope with cravings and make quitting easier for you. How can I deal with withdrawal symptoms? Your body may experience negative effects as it tries to get used to not having nicotine in the system. These effects are called withdrawal symptoms. They may include:  Feeling hungrier than normal.  Trouble  concentrating.  Irritability.  Trouble sleeping.  Feeling depressed.  Restlessness and agitation.  Craving a cigarette. To manage withdrawal symptoms:  Avoid places, people, and activities that trigger your cravings.  Remember why you want to quit.  Get plenty of sleep.  Avoid coffee and other caffeinated drinks. These may worsen some of your symptoms. How can I handle social situations? Social situations can be difficult when you are quitting smoking, especially in the first few weeks. To manage this, you can:  Avoid parties, bars, and other social situations where people might be smoking.  Avoid alcohol.  Leave right away if you have the urge to smoke.  Explain to your family and friends that you are quitting smoking. Ask for understanding and support.  Plan activities with friends or family where smoking is not an option. What are some ways I can cope with stress? Wanting to smoke may cause stress, and stress can make you want to smoke. Find ways to manage your stress. Relaxation techniques can help. For example:  Breathe slowly and deeply, in through your nose and out through your mouth.  Listen to soothing, relaxing music.  Talk with a family member or friend about your stress.  Light a candle.  Soak in a bath or take a shower.  Think about a peaceful place. What are some ways I can prevent weight gain? Be aware that many people gain weight after they quit smoking. However, not everyone  does. To keep from gaining weight, have a plan in place before you quit and stick to the plan after you quit. Your plan should include:  Having healthy snacks. When you have a craving, it may help to: ? Eat plain popcorn, crunchy carrots, celery, or other cut vegetables. ? Chew sugar-free gum.  Changing how you eat: ? Eat small portion sizes at meals. ? Eat 4-6 small meals throughout the day instead of 1-2 large meals a day. ? Be mindful when you eat. Do not watch  television or do other things that might distract you as you eat.  Exercising regularly: ? Make time to exercise each day. If you do not have time for a long workout, do short bouts of exercise for 5-10 minutes several times a day. ? Do some form of strengthening exercise, like weight lifting, and some form of aerobic exercise, like running or swimming.  Drinking plenty of water or other low-calorie or no-calorie drinks. Drink 6-8 glasses of water daily, or as much as instructed by your health care provider. Summary  Quitting smoking is a physical and mental challenge. You will face cravings, withdrawal symptoms, and temptation to smoke again. Preparation can help you as you go through these challenges.  You can cope with cravings by keeping your mouth busy (such as by chewing gum), keeping your body and hands busy, and making calls to family, friends, or a helpline for people who want to quit smoking.  You can cope with withdrawal symptoms by avoiding places where people smoke, avoiding drinks with caffeine, and getting plenty of rest.  Ask your health care provider about the different ways to prevent weight gain, avoid stress, and handle social situations. This information is not intended to replace advice given to you by your health care provider. Make sure you discuss any questions you have with your health care provider. Document Revised: 01/18/2017 Document Reviewed: 02/03/2016 Elsevier Patient Education  2020 [employer].

## 2019-10-02 ENCOUNTER — Telehealth: Payer: Self-pay | Admitting: Family Medicine

## 2019-10-02 MED ORDER — ONETOUCH VERIO VI STRP: ORAL_STRIP | 3 refills | Status: DC

## 2019-10-02 NOTE — Telephone Encounter (Signed)
Patient is requesting a prescription to be sent into pharmacy for One touch verio test strips qty of 100.  Per patient , please indicate that patient will check blood glucose three times daily before meal , that way it will be covered by insurance.

## 2019-10-02 NOTE — Telephone Encounter (Signed)
Sent in

## 2019-11-15 ENCOUNTER — Other Ambulatory Visit: Payer: Self-pay | Admitting: Family Medicine

## 2019-12-21 ENCOUNTER — Ambulatory Visit: Payer: BC Managed Care – PPO | Admitting: Family Medicine

## 2019-12-21 ENCOUNTER — Encounter: Payer: Self-pay | Admitting: Family Medicine

## 2019-12-21 ENCOUNTER — Other Ambulatory Visit: Payer: Self-pay

## 2019-12-21 VITALS — BP 110/62 | HR 80 | Temp 97.7°F | Ht 69.0 in | Wt 167.1 lb

## 2019-12-21 DIAGNOSIS — E1165 Type 2 diabetes mellitus with hyperglycemia: Secondary | ICD-10-CM

## 2019-12-21 DIAGNOSIS — Z720 Tobacco use: Secondary | ICD-10-CM | POA: Diagnosis not present

## 2019-12-21 MED ORDER — BUPROPION HCL ER (SMOKING DET) 150 MG PO TB12: ORAL_TABLET | ORAL | 2 refills | Status: DC

## 2019-12-21 NOTE — Progress Notes (Signed)
Subjective:   Chief Complaint  Patient presents with   Follow-up    3 month DM    Dennis Stanton is a 59 y.o. male here for follow-up of diabetes.  He is here with his wife. Torey's self monitored glucose range is low 100's.  Patient denies hypoglycemic reactions. He checks his glucose levels 1 time(s) per day. Patient does not require insulin.   Medications include: metformin 1000 mg bid, Actos 30 mg/d, Farxiga 10 mg/d Diet is improved.  Exercise: walking   Tobacco abuse The patient will go through pack of cigarettes and sometimes cigars over 7 days.  He did not fill the Chantix due to cost.  Interested in [place].  Past Medical History:  Diagnosis Date   Allergy    Diabetes mellitus without complication (HCC)    GERD (gastroesophageal reflux disease)    Hyperlipidemia      Related testing: Retinal exam: Done Pneumovax: done  Objective:  BP 110/62 (BP Location: Left Arm, Patient Position: Sitting, Cuff Size: Normal)    Pulse 80    Temp 97.7 F (36.5 C) (Oral)    Ht 5\' 9"  (1.753 m)    Wt 167 lb 2 oz (75.8 kg)    SpO2 97%    BMI 24.68 kg/m  General:  Well developed, well nourished, in no apparent distress Skin:  Warm, no pallor or diaphoresis Head:  Normocephalic, atraumatic Eyes:  Pupils equal and round, sclera anicteric without injection  Lungs:  CTAB, no access msc use Cardio:  RRR, no bruits, no LE edema Musculoskeletal:  Symmetrical muscle groups noted without atrophy or deformity Neuro:  Sensation intact to pinprick on feet Psych: Age appropriate judgment and insight  Assessment:   Uncontrolled type 2 diabetes mellitus with hyperglycemia (HCC) - Plan: Hemoglobin A1c  Tobacco abuse - Plan: buPROPion (ZYBAN) 150 MG 12 hr tablet   Plan:   1.  Status-chronic, uncontrolled; doing well with diet and exercise.  He was little concerned with his continued weight loss.  Initially it was likely due to elevated sugar but most recently it was likely due to  lifestyle modifications.  Continue Metformin 1000 mg twice daily, Actos 30 mg daily and Farxiga 10 mg daily.  He does not want injectable, we could consider oral semaglutide. 2.  Trial Zyban.  150 mg daily for 3 days and then twice daily after that.  He will start 1 week before quit date. F/u in 3-6 mo. The patient voiced understanding and agreement to the plan.  [place], DO 12/21/19 12:02 PM

## 2019-12-21 NOTE — Patient Instructions (Addendum)
Give [place] 2-3 business days to get the results of your labs back.   Keep the diet clean and stay active.  Strong work with your weight loss.  Call your eye provider for an appointment.   Let us know if you need anything.

## 2019-12-22 ENCOUNTER — Ambulatory Visit: Payer: BC Managed Care – PPO | Admitting: Family Medicine

## 2019-12-22 LAB — HEMOGLOBIN A1C
Hgb A1c MFr Bld: 6.7 % of total Hgb — ABNORMAL HIGH (ref <5.7)
Mean Plasma Glucose: 146 (calc)
eAG (mmol/L): 8.1 (calc)

## 2020-01-03 ENCOUNTER — Other Ambulatory Visit: Payer: Self-pay | Admitting: Family Medicine

## 2020-02-07 ENCOUNTER — Other Ambulatory Visit: Payer: Self-pay | Admitting: Family Medicine

## 2020-03-27 ENCOUNTER — Other Ambulatory Visit: Payer: Self-pay | Admitting: Family Medicine

## 2020-04-10 ENCOUNTER — Other Ambulatory Visit: Payer: Self-pay | Admitting: Family Medicine

## 2020-04-10 DIAGNOSIS — E119 Type 2 diabetes mellitus without complications: Secondary | ICD-10-CM

## 2020-04-15 ENCOUNTER — Encounter: Payer: Self-pay | Admitting: Family Medicine

## 2020-04-15 ENCOUNTER — Other Ambulatory Visit: Payer: Self-pay

## 2020-04-15 ENCOUNTER — Ambulatory Visit: Payer: BC Managed Care – PPO | Admitting: Family Medicine

## 2020-04-15 VITALS — BP 110/62 | HR 76 | Temp 98.2°F | Ht 67.0 in | Wt 170.4 lb

## 2020-04-15 DIAGNOSIS — R5383 Other fatigue: Secondary | ICD-10-CM

## 2020-04-15 NOTE — Progress Notes (Signed)
Chief Complaint  Patient presents with  . Weight Loss    Subjective: Patient is a 60 y.o. male here for fatigue. Here w spouse.   6 mo ago started having more fatigue, shaking, anxiousness. He does have a famhx of thyroid disorders. Sugars are more controlled. His last A1c was 6.7 in Nov. Will also have hot flashes randomly. Diet is healthy overall, he is exercising routinely.  He does not snore much at night and his wife does not notice he gasps for air or stops breathing.  He does not wake up gasping for air.  He has been more on edge/irritable.  He wonders if it is related to his retirement.  No depression.  Past Medical History:  Diagnosis Date  . Allergy   . Diabetes mellitus without complication ([place])   . GERD (gastroesophageal reflux disease)   . Hyperlipidemia     Objective: BP 110/62 (BP Location: Left Arm, Patient Position: Sitting, Cuff Size: Normal)   Pulse 76   Temp 98.2 F (36.8 C) (Oral)   Ht 5\' 7"  (1.702 m)   Wt 170 lb 6 oz (77.3 kg)   SpO2 97%   BMI 26.68 kg/m  General: Awake, appears stated age HEENT: MMM, EOMi Heart: RRR, no lower extremity edema Neck: Supple, no thyromegaly or asymmetry noted. Lungs: CTAB, no rales, wheezes or rhonchi. No accessory muscle use Psych: Age appropriate judgment and insight, normal affect and mood  Assessment and Plan: Fatigue, unspecified type - Plan: CBC, Comprehensive metabolic panel, TSH, VITAMIN D 25 Hydroxy (Vit-D Deficiency, Fractures), Testosterone  Given the hot flashes we will check a morning testosterone.  We will have him come back for this.  His sugars are like they have been normal but if the glucose is abnormal in the CMP, will add on A1c.  I would like him to return for morning labs and then schedule a follow-up appointment with me 1 week after.  We will treat for anxiety if labs are normal. The patient and his spouse voiced understanding and agreement to the plan.  [place], DO 04/15/20  2:16  PM

## 2020-04-15 NOTE — Patient Instructions (Signed)
Give us 2-3 business days to get the results of your labs back.   Keep the diet clean and stay active.  Aim to do some physical exertion for 150 minutes per week. This is typically divided into 5 days per week, 30 minutes per day. The activity should be enough to get your heart rate up. Anything is better than nothing if you have time constraints.  Let us know if you need anything.  

## 2020-04-19 ENCOUNTER — Other Ambulatory Visit: Payer: Self-pay

## 2020-04-19 ENCOUNTER — Other Ambulatory Visit (INDEPENDENT_AMBULATORY_CARE_PROVIDER_SITE_OTHER): Payer: BC Managed Care – PPO

## 2020-04-19 DIAGNOSIS — R5383 Other fatigue: Secondary | ICD-10-CM | POA: Diagnosis not present

## 2020-04-19 LAB — CBC
HCT: 45.5 % (ref 39.0–52.0)
Hemoglobin: 15.8 g/dL (ref 13.0–17.0)
MCHC: 34.7 g/dL (ref 30.0–36.0)
MCV: 92.9 fl (ref 78.0–100.0)
Platelets: 102 10*3/uL — ABNORMAL LOW (ref 150.0–400.0)
RBC: 4.9 Mil/uL (ref 4.22–5.81)
RDW: 14.8 % (ref 11.5–15.5)
WBC: 4.7 10*3/uL (ref 4.0–10.5)

## 2020-04-19 LAB — COMPREHENSIVE METABOLIC PANEL
ALT: 15 U/L (ref 0–53)
AST: 13 U/L (ref 0–37)
Albumin: 4.5 g/dL (ref 3.5–5.2)
Alkaline Phosphatase: 45 U/L (ref 39–117)
BUN: 18 mg/dL (ref 6–23)
CO2: 26 mEq/L (ref 19–32)
Calcium: 9.8 mg/dL (ref 8.4–10.5)
Chloride: 102 mEq/L (ref 96–112)
Creatinine, Ser: 0.75 mg/dL (ref 0.40–1.50)
GFR: 97.36 mL/min (ref >60.00)
Glucose, Bld: 144 mg/dL — ABNORMAL HIGH (ref 70–99)
Potassium: 3.7 mEq/L (ref 3.5–5.1)
Sodium: 138 mEq/L (ref 135–145)
Total Bilirubin: 0.8 mg/dL (ref 0.2–1.2)
Total Protein: 7 g/dL (ref 6.0–8.3)

## 2020-04-19 LAB — TSH: TSH: 2.38 u[IU]/mL (ref 0.35–4.50)

## 2020-04-19 LAB — TESTOSTERONE: Testosterone: 154.86 ng/dL — ABNORMAL LOW (ref 300.00–890.00)

## 2020-04-19 LAB — VITAMIN D 25 HYDROXY (VIT D DEFICIENCY, FRACTURES): VITD: 23.07 ng/mL — ABNORMAL LOW (ref 30.00–100.00)

## 2020-04-26 ENCOUNTER — Ambulatory Visit: Payer: BC Managed Care – PPO | Admitting: Family Medicine

## 2020-04-27 ENCOUNTER — Other Ambulatory Visit: Payer: Self-pay

## 2020-04-27 ENCOUNTER — Encounter: Payer: Self-pay | Admitting: Family Medicine

## 2020-04-27 ENCOUNTER — Ambulatory Visit: Payer: BC Managed Care – PPO | Admitting: Family Medicine

## 2020-04-27 VITALS — BP 107/71 | HR 80 | Temp 98.2°F | Ht 67.0 in | Wt 173.8 lb

## 2020-04-27 DIAGNOSIS — R519 Headache, unspecified: Secondary | ICD-10-CM

## 2020-04-27 DIAGNOSIS — R7989 Other specified abnormal findings of blood chemistry: Secondary | ICD-10-CM | POA: Diagnosis not present

## 2020-04-27 DIAGNOSIS — E559 Vitamin D deficiency, unspecified: Secondary | ICD-10-CM | POA: Diagnosis not present

## 2020-04-27 NOTE — Patient Instructions (Addendum)
We will be in touch regarding your lab results.   Continue the Vit D.  Stay active.   Heat (pad or rice pillow in microwave) over affected area, 10-15 minutes twice daily.   Ice/cold pack over area for 10-15 min twice daily.  OK to take Tylenol 1000 mg (2 extra strength tabs) or 975 mg (3 regular strength tabs) every 6 hours as needed.  Let us know if you need anything.  EXERCISES RANGE OF MOTION (ROM) AND STRETCHING EXERCISES  These exercises may help you when beginning to rehabilitate your issue. In order to successfully resolve your symptoms, you must improve your posture. These exercises are designed to help reduce the forward-head and rounded-shoulder posture which contributes to this condition. Your symptoms may resolve with or without further involvement from your physician, physical therapist or athletic trainer. While completing these exercises, remember:   Restoring tissue flexibility helps normal motion to return to the joints. This allows healthier, less painful movement and activity.  An effective stretch should be held for at least 20 seconds, although you may need to begin with shorter hold times for comfort.  A stretch should never be painful. You should only feel a gentle lengthening or release in the stretched tissue.  Do not do any stretch or exercise that you cannot tolerate.  STRETCH- Axial Extensors  Lie on your back on the floor. You may bend your knees for comfort. Place a rolled-up hand towel or dish towel, about 2 inches in diameter, under the part of your head that makes contact with the floor.  Gently tuck your chin, as if trying to make a "double chin," until you feel a gentle stretch at the base of your head.  Hold 15-20 seconds. Repeat 2-3 times. Complete this exercise 1 time per day.   STRETCH - Axial Extension   Stand or sit on a firm surface. Assume a good posture: chest up, shoulders drawn back, abdominal muscles slightly tense, knees unlocked  (if standing) and feet hip width apart.  Slowly retract your chin so your head slides back and your chin slightly lowers. Continue to look straight ahead.  You should feel a gentle stretch in the back of your head. Be certain not to feel an aggressive stretch since this can cause headaches later.  Hold for 15-20 seconds. Repeat 2-3 times. Complete this exercise 1 time per day.  STRETCH - Cervical Side Bend   Stand or sit on a firm surface. Assume a good posture: chest up, shoulders drawn back, abdominal muscles slightly tense, knees unlocked (if standing) and feet hip width apart.  Without letting your nose or shoulders move, slowly tip your right / left ear to your shoulder until your feel a gentle stretch in the muscles on the opposite side of your neck.  Hold 15-20 seconds. Repeat 2-3 times. Complete this exercise 1-2 times per day.  STRETCH - Cervical Rotators   Stand or sit on a firm surface. Assume a good posture: chest up, shoulders drawn back, abdominal muscles slightly tense, knees unlocked (if standing) and feet hip width apart.  Keeping your eyes level with the ground, slowly turn your head until you feel a gentle stretch along the back and opposite side of your neck.  Hold 15-20 seconds. Repeat 2-3 times. Complete this exercise 1-2 times per day.  RANGE OF MOTION - Neck Circles   Stand or sit on a firm surface. Assume a good posture: chest up, shoulders drawn back, abdominal muscles slightly tense, knees  unlocked (if standing) and feet hip width apart.  Gently roll your head down and around from the back of one shoulder to the back of the other. The motion should never be forced or painful.  Repeat the motion 10-20 times, or until you feel the neck muscles relax and loosen. Repeat 2-3 times. Complete the exercise 1-2 times per day. STRENGTHENING EXERCISES - Cervical Strain and Sprain These exercises may help you when beginning to rehabilitate your injury. They may  resolve your symptoms with or without further involvement from your physician, physical therapist, or athletic trainer. While completing these exercises, remember:   Muscles can gain both the endurance and the strength needed for everyday activities through controlled exercises.  Complete these exercises as instructed by your physician, physical therapist, or athletic trainer. Progress the resistance and repetitions only as guided.  You may experience muscle soreness or fatigue, but the pain or discomfort you are trying to eliminate should never worsen during these exercises. If this pain does worsen, stop and make certain you are following the directions exactly. If the pain is still present after adjustments, discontinue the exercise until you can discuss the trouble with your clinician.  STRENGTH - Cervical Flexors, Isometric  Face a wall, standing about 6 inches away. Place a small pillow, a ball about 6-8 inches in diameter, or a folded towel between your forehead and the wall.  Slightly tuck your chin and gently push your forehead into the soft object. Push only with mild to moderate intensity, building up tension gradually. Keep your jaw and forehead relaxed.  Hold 10 to 20 seconds. Keep your breathing relaxed.  Release the tension slowly. Relax your neck muscles completely before you start the next repetition. Repeat 2-3 times. Complete this exercise 1 time per day.  STRENGTH- Cervical Lateral Flexors, Isometric   Stand about 6 inches away from a wall. Place a small pillow, a ball about 6-8 inches in diameter, or a folded towel between the side of your head and the wall.  Slightly tuck your chin and gently tilt your head into the soft object. Push only with mild to moderate intensity, building up tension gradually. Keep your jaw and forehead relaxed.  Hold 10 to 20 seconds. Keep your breathing relaxed.  Release the tension slowly. Relax your neck muscles completely before you start  the next repetition. Repeat 2-3 times. Complete this exercise 1 time per day.  STRENGTH - Cervical Extensors, Isometric   Stand about 6 inches away from a wall. Place a small pillow, a ball about 6-8 inches in diameter, or a folded towel between the back of your head and the wall.  Slightly tuck your chin and gently tilt your head back into the soft object. Push only with mild to moderate intensity, building up tension gradually. Keep your jaw and forehead relaxed.  Hold 10 to 20 seconds. Keep your breathing relaxed.  Release the tension slowly. Relax your neck muscles completely before you start the next repetition. Repeat 2-3 times. Complete this exercise 1 time per day.  POSTURE AND BODY MECHANICS CONSIDERATIONS Keeping correct posture when sitting, standing or completing your activities will reduce the stress put on different body tissues, allowing injured tissues a chance to heal and limiting painful experiences. The following are general guidelines for improved posture. Your physician or physical therapist will provide you with any instructions specific to your needs. While reading these guidelines, remember:  The exercises prescribed by your provider will help you have the flexibility and  strength to maintain correct postures.  The correct posture provides the optimal environment for your joints to work. All of your joints have less wear and tear when properly supported by a spine with good posture. This means you will experience a healthier, less painful body.  Correct posture must be practiced with all of your activities, especially prolonged sitting and standing. Correct posture is as important when doing repetitive low-stress activities (typing) as it is when doing a single heavy-load activity (lifting).  PROLONGED STANDING WHILE SLIGHTLY LEANING FORWARD When completing a task that requires you to lean forward while standing in one place for a long time, place either foot up on a  stationary 2- to 4-inch high object to help maintain the best posture. When both feet are on the ground, the low back tends to lose its slight inward curve. If this curve flattens (or becomes too large), then the back and your other joints will experience too much stress, fatigue more quickly, and can cause pain.   RESTING POSITIONS Consider which positions are most painful for you when choosing a resting position. If you have pain with flexion-based activities (sitting, bending, stooping, squatting), choose a position that allows you to rest in a less flexed posture. You would want to avoid curling into a fetal position on your side. If your pain worsens with extension-based activities (prolonged standing, working overhead), avoid resting in an extended position such as sleeping on your stomach. Most people will find more comfort when they rest with their spine in a more neutral position, neither too rounded nor too arched. Lying on a non-sagging bed on your side with a pillow between your knees, or on your back with a pillow under your knees will often provide some relief. Keep in mind, being in any one position for a prolonged period of time, no matter how correct your posture, can still lead to stiffness.  WALKING Walk with an upright posture. Your ears, shoulders, and hips should all line up. OFFICE WORK When working at a desk, create an environment that supports good, upright posture. Without extra support, muscles fatigue and lead to excessive strain on joints and other tissues.  CHAIR:  A chair should be able to slide under your desk when your back makes contact with the back of the chair. This allows you to work closely.  The chair's height should allow your eyes to be level with the upper part of your monitor and your hands to be slightly lower than your elbows.  Body position: ? Your feet should make contact with the floor. If this is not possible, use a foot rest. ? Keep your ears over  your shoulders. This will reduce stress on your neck and low back.

## 2020-04-27 NOTE — Progress Notes (Signed)
Chief Complaint  Patient presents with  . Follow-up    One week, discuss labs    Subjective: Patient is a 60 y.o. male here for f/u fatigue.  He is here with his wife.  He was here couple weeks ago for fatigue.  Labs showed a slightly low vitamin D in the insufficiency range.  He was started on 2000 units daily.  He was also found to have low testosterone.  His confirmatory test will be scheduled next week.  He has a poor sex drive and a family history of low testosterone.  He has never taken any supplemental testosterone or head replacement.  His diet has improved.  He does exercise fairly routinely.  He feels slightly better since starting the vitamin D.  The patient also mentioned a bilateral headache in the back of his head that would come around once per week.  It last for several hours and then spontaneously resolves.  He has associated bilateral neck pain as well.  No injury or change in activity.  He denies any vision changes, difficulty swallowing, trouble speech, balance issues, weakness, bruising, swelling, or numbness/tingling.  Past Medical History:  Diagnosis Date  . Allergy   . Diabetes mellitus without complication ([place])   . GERD (gastroesophageal reflux disease)   . Hyperlipidemia     Objective: BP 107/71 (BP Location: Right Arm, Patient Position: Sitting, Cuff Size: Large)   Pulse 80   Temp 98.2 F (36.8 C) (Oral)   Ht 5\' 7"  (1.702 m)   Wt 173 lb 12.8 oz (78.8 kg)   SpO2 98%   BMI 27.22 kg/m  General: Awake, appears stated age HEENT: MMM, EOMi, PERRLA Heart: RRR, no murmurs Lungs: CTAB, no rales, wheezes or rhonchi. No accessory muscle use Neuro: DTRs equal and symmetric throughout, no clonus speech is fluent and goal oriented, 5/5 strength throughout. MSK: Slight tenderness to palpation of the suboccipital triangle and paraspinal musculature of the cervical spine Psych: Age appropriate judgment and insight, normal affect and mood  Assessment and Plan: Low  testosterone in male - Plan: Testosterone Total,Free,Bio, Males, FSH, LH, PSA  Vitamin D insufficiency  Occipital headache  1.  Check repeat labs next week. 2.  Continue supplemental vitamin D 2000 use daily. 3.  Heat, ice, stretches and exercises provided.  If no improvement, would consider imaging versus physical therapy.  He was instructed to reach out in 1 month if no improvement. We will follow-up pending the above results. The patient and his spouse voiced understanding and agreement to the plan.  [place], DO 04/27/20  2:13 PM

## 2020-05-05 ENCOUNTER — Other Ambulatory Visit (INDEPENDENT_AMBULATORY_CARE_PROVIDER_SITE_OTHER): Payer: BC Managed Care – PPO

## 2020-05-05 ENCOUNTER — Other Ambulatory Visit: Payer: Self-pay

## 2020-05-05 DIAGNOSIS — R7989 Other specified abnormal findings of blood chemistry: Secondary | ICD-10-CM | POA: Diagnosis not present

## 2020-05-05 LAB — LUTEINIZING HORMONE: LH: 42.15 m[IU]/mL — ABNORMAL HIGH (ref 1.50–9.30)

## 2020-05-05 LAB — FOLLICLE STIMULATING HORMONE: FSH: 60.4 m[IU]/mL — ABNORMAL HIGH (ref 1.4–18.1)

## 2020-05-05 LAB — PSA: PSA: 0.08 ng/mL — ABNORMAL LOW (ref 0.10–4.00)

## 2020-05-09 ENCOUNTER — Other Ambulatory Visit: Payer: Self-pay | Admitting: Family Medicine

## 2020-06-20 ENCOUNTER — Other Ambulatory Visit: Payer: Self-pay

## 2020-06-20 ENCOUNTER — Ambulatory Visit (INDEPENDENT_AMBULATORY_CARE_PROVIDER_SITE_OTHER): Payer: BC Managed Care – PPO | Admitting: Family Medicine

## 2020-06-20 ENCOUNTER — Encounter: Payer: Self-pay | Admitting: Family Medicine

## 2020-06-20 VITALS — BP 120/78 | HR 83 | Temp 97.4°F | Ht 68.0 in | Wt 167.1 lb

## 2020-06-20 DIAGNOSIS — E1165 Type 2 diabetes mellitus with hyperglycemia: Secondary | ICD-10-CM

## 2020-06-20 DIAGNOSIS — Z0000 Encounter for general adult medical examination without abnormal findings: Secondary | ICD-10-CM

## 2020-06-20 DIAGNOSIS — R7989 Other specified abnormal findings of blood chemistry: Secondary | ICD-10-CM | POA: Diagnosis not present

## 2020-06-20 DIAGNOSIS — Z122 Encounter for screening for malignant neoplasm of respiratory organs: Secondary | ICD-10-CM

## 2020-06-20 LAB — LIPID PANEL
Cholesterol: 118 mg/dL (ref 0–200)
HDL: 47.5 mg/dL (ref >39.00)
LDL Cholesterol: 57 mg/dL (ref 0–99)
NonHDL: 70.65
Total CHOL/HDL Ratio: 2
Triglycerides: 66 mg/dL (ref 0.0–149.0)
VLDL: 13.2 mg/dL (ref 0.0–40.0)

## 2020-06-20 LAB — COMPREHENSIVE METABOLIC PANEL
ALT: 21 U/L (ref 0–53)
AST: 17 U/L (ref 0–37)
Albumin: 4.5 g/dL (ref 3.5–5.2)
Alkaline Phosphatase: 54 U/L (ref 39–117)
BUN: 22 mg/dL (ref 6–23)
CO2: 27 mEq/L (ref 19–32)
Calcium: 9.8 mg/dL (ref 8.4–10.5)
Chloride: 100 mEq/L (ref 96–112)
Creatinine, Ser: 0.77 mg/dL (ref 0.40–1.50)
GFR: 96.47 mL/min (ref >60.00)
Glucose, Bld: 153 mg/dL — ABNORMAL HIGH (ref 70–99)
Potassium: 4.2 mEq/L (ref 3.5–5.1)
Sodium: 136 mEq/L (ref 135–145)
Total Bilirubin: 0.8 mg/dL (ref 0.2–1.2)
Total Protein: 6.9 g/dL (ref 6.0–8.3)

## 2020-06-20 LAB — HEMOGLOBIN A1C: Hgb A1c MFr Bld: 7.6 % — ABNORMAL HIGH (ref 4.6–6.5)

## 2020-06-20 NOTE — Patient Instructions (Addendum)
Try to get 60-65 g of protein in daily.   Give [place] 2-3 business days to get the results of your labs back.   Keep the diet clean and stay active.  Continue with the weight resistance exercise.  The new Shingrix vaccine (for shingles) is a 2 shot series. It can make people feel low energy, achy and almost like they have the flu for 48 hours after injection. Please plan accordingly when deciding on when to get this shot. Call our office for a nurse visit appointment to get this. The second shot of the series is less severe regarding the side effects, but it still lasts 48 hours.   Let us know if you need anything.

## 2020-06-20 NOTE — Progress Notes (Signed)
Chief Complaint  Patient presents with  . Annual Exam    Well Male Dennis Stanton is here for a complete physical.   His last physical was >1 year ago.  Current diet: in general, a "healthy" diet.  Current exercise: wt resistance exercise Weight trend: stable Fatigue out of ordinary? No. Seat belt? Yes.   Loss of interest in activities or depression in last 2 weeks? No  Health maintenance Shingrix- No Colonoscopy- Yes Tetanus- Yes HIV- Yes Hep C- Yes Lung cancer screening- No   Past Medical History:  Diagnosis Date  . Allergy   . Diabetes mellitus without complication ([place])   . GERD (gastroesophageal reflux disease)   . Hyperlipidemia       Past Surgical History:  Procedure Laterality Date  . broken elbow Right     Medications  Current Outpatient Medications on File Prior to Visit  Medication Sig Dispense Refill  . aspirin 81 MG chewable tablet Chew 1 mg by mouth daily.    Marland Kitchen atorvastatin (LIPITOR) 20 MG tablet Take 1 tablet by mouth once daily 90 tablet 1  . buPROPion (ZYBAN) 150 MG 12 hr tablet 1 week before quit date, take 1 tab daily for 3 days and then 1 tab twice daily. 60 tablet 2  . FARXIGA 10 MG TABS tablet Take 1 tablet by mouth once daily 90 tablet 0  . glucose blood (ONETOUCH VERIO) test strip Check blood sugar 3 times daily.  DX E11.9 100 each 3  . lisinopril (ZESTRIL) 10 MG tablet Take 1 tablet (10 mg total) by mouth daily. 90 tablet 1  . metFORMIN (GLUCOPHAGE) 1000 MG tablet Take 1 tablet (1,000 mg total) by mouth 2 (two) times daily with a meal. 180 tablet 1  . pioglitazone (ACTOS) 30 MG tablet Take 1 tablet by mouth once daily 90 tablet 0   Allergies No Known Allergies  Family History Family History  Problem Relation Age of Onset  . Bladder Cancer Father   . Diabetes Mother   . Heart disease Mother   . Hypertension Mother   . Diabetes Sister   . Colon cancer Neg Hx   . Esophageal cancer Neg Hx   . Rectal cancer Neg Hx   . Stomach cancer  Neg Hx     Review of Systems: Constitutional:  no fevers Eye:  no recent significant change in vision Ear/Nose/Mouth/Throat:  Ears:  no hearing loss Nose/Mouth/Throat:  no complaints of nasal congestion, no sore throat Cardiovascular:  no chest pain Respiratory:  no shortness of breath Gastrointestinal:  no change in bowel habits GU:  Male: negative for dysuria, frequency Musculoskeletal/Extremities:  no joint pain Integumentary (Skin/Breast):  no abnormal skin lesions reported Neurologic:  no headaches Endocrine: No unexpected weight changes Hematologic/Lymphatic:  no abnormal bleeding  Exam BP 120/78 (BP Location: Left Arm, Patient Position: Sitting, Cuff Size: Normal)   Pulse 83   Temp (!) 97.4 F (36.3 C) (Oral)   Ht 5\' 8"  (1.727 m)   Wt 167 lb 2 oz (75.8 kg)   SpO2 96%   BMI 25.41 kg/m  General:  well developed, well nourished, in no apparent distress Skin:  no significant moles, warts, or growths Head:  no masses, lesions, or tenderness Eyes:  pupils equal and round, sclera anicteric without injection Ears:  canals without lesions, TMs shiny without retraction, no obvious effusion, no erythema Nose:  nares patent, septum midline, mucosa normal Throat/Pharynx:  lips and gingiva without lesion; tongue and uvula midline; non-inflamed pharynx; no  exudates or postnasal drainage Neck: neck supple without adenopathy, thyromegaly, or masses Cardiac: RRR, no bruits, no LE edema Lungs:  clear to auscultation, breath sounds equal bilaterally, no respiratory distress Abdomen: BS+, soft, non-tender, non-distended, no masses or organomegaly noted Rectal: Deferred Musculoskeletal:  symmetrical muscle groups noted without atrophy or deformity Neuro:  gait normal; deep tendon reflexes normal and symmetric Psych: well oriented with normal range of affect and appropriate judgment/insight  Assessment and Plan  Well adult exam  Type 2 diabetes mellitus with hyperglycemia, without  long-term current use of insulin ([facility]) - Plan: Comprehensive metabolic panel, Lipid panel, Hemoglobin A1c  Low testosterone in male - Plan: Testosterone Total,Free,Bio, Males-([facility])  Screening for lung cancer - Plan: CT CHEST LUNG CANCER SCREENING LOW DOSE WO CONTRAST   Well 60 y.o. male. Counseled on diet and exercise. Had PSA screening recently done.  Discussed Shingrix and info provided.  Immunizations, labs, and further orders as above. Follow up in 6 mo pending above. The patient voiced understanding and agreement to the plan.  [place], DO 06/20/20 11:17 AM

## 2020-06-21 LAB — TESTOSTERONE TOTAL,FREE,BIO, MALES
Albumin: 4.4 g/dL (ref 3.6–5.1)
Sex Hormone Binding: 63 nmol/L (ref 22–77)
Testosterone: 123 ng/dL — ABNORMAL LOW (ref 250–827)

## 2020-06-28 ENCOUNTER — Other Ambulatory Visit: Payer: Self-pay | Admitting: Family Medicine

## 2020-07-11 ENCOUNTER — Other Ambulatory Visit: Payer: Self-pay | Admitting: Family Medicine

## 2020-07-11 DIAGNOSIS — E119 Type 2 diabetes mellitus without complications: Secondary | ICD-10-CM

## 2020-07-14 ENCOUNTER — Other Ambulatory Visit: Payer: Self-pay

## 2020-07-14 ENCOUNTER — Ambulatory Visit (HOSPITAL_BASED_OUTPATIENT_CLINIC_OR_DEPARTMENT_OTHER)
Admission: RE | Admit: 2020-07-14 | Discharge: 2020-07-14 | Disposition: A | Discharge status: Home / Self Care | Payer: BC Managed Care – PPO | Source: Ambulatory Visit | Attending: Family Medicine | Admitting: Family Medicine

## 2020-07-14 DIAGNOSIS — Z122 Encounter for screening for malignant neoplasm of respiratory organs: Secondary | ICD-10-CM | POA: Diagnosis present

## 2020-07-20 ENCOUNTER — Telehealth: Payer: Self-pay | Admitting: Family Medicine

## 2020-07-20 ENCOUNTER — Other Ambulatory Visit: Payer: Self-pay | Admitting: Family Medicine

## 2020-07-20 DIAGNOSIS — R7989 Other specified abnormal findings of blood chemistry: Secondary | ICD-10-CM

## 2020-07-20 NOTE — Telephone Encounter (Signed)
Referral done

## 2020-07-20 NOTE — Telephone Encounter (Signed)
Pt's wife is calling stating that pt is still losing weight, and would like to know if Dr. Nani Ravens can send a referral for a Endo dr. Please advise. Wife states Dr. Nani Ravens is aware of what is going on with pt

## 2020-09-20 ENCOUNTER — Ambulatory Visit: Payer: BC Managed Care – PPO | Admitting: Internal Medicine

## 2020-09-20 ENCOUNTER — Other Ambulatory Visit: Payer: Self-pay

## 2020-09-20 VITALS — BP 122/70 | HR 81 | Ht 68.0 in | Wt 167.4 lb

## 2020-09-20 DIAGNOSIS — R7989 Other specified abnormal findings of blood chemistry: Secondary | ICD-10-CM

## 2020-09-20 DIAGNOSIS — R634 Abnormal weight loss: Secondary | ICD-10-CM | POA: Insufficient documentation

## 2020-09-20 NOTE — Patient Instructions (Signed)
-   PLease return for repeat fasting labs

## 2020-09-20 NOTE — Progress Notes (Signed)
Name: Dennis Stanton  MRN/ DOB: [id number], June 04, 1960    Age/ Sex: 60 y.o., male    PCP: Shelda Pal, DO   Reason for Endocrinology Evaluation: Low testosterone     Date of Initial Endocrinology Evaluation: 09/20/2020     HPI: Mr. Dennis Stanton is a 61 y.o. male with a past medical history of T2DM and Dyslipidemia . The patient presented for initial endocrinology clinic visit on 09/20/2020 for consultative assistance with his Low testosterone.   He has been noted with low testosterone during routine work up in 04/2020 and again in 06/2020 with a nadir of 123 ng/dL , with an elevated LH and FSH at 42.15 an 60.4 respectively.     Pt under the impression he is here for weight loss.  Per wife they made lifestyle changes and has helped with improving glycemic control    He has been tired for the past yr  He has erectile dysfunction for the several years  Has one biological daughter who is 70 years of age  No prior exposure to radiation, scrotal injury or mumps infection  Does not sleep well at night   NO narcotic use  Denies marijuana products  He has heat intolerance   Denies rash  Denies diarrhea ,occasional constipation     Has occasional headaches and blurry vision   Questionable prostate cancer in father  He is a smoker      Mother with DM    HISTORY:  Past Medical History:  Past Medical History:  Diagnosis Date   Allergy    Diabetes mellitus without complication ([place])    GERD (gastroesophageal reflux disease)    Hyperlipidemia    Past Surgical History:  Past Surgical History:  Procedure Laterality Date   broken elbow Right     Social History:  reports that he has been smoking cigarettes. He has a 20.00 pack-year smoking history. He has never used smokeless tobacco. He reports current alcohol use. He reports that he does not use drugs. Family History: family history includes Bladder Cancer in his father; Diabetes in his mother and sister;  Heart disease in his mother; Hypertension in his mother.   HOME MEDICATIONS: Allergies as of 09/20/2020   No Known Allergies      Medication List        Accurate as of September 20, 2020 12:53 PM. If you have any questions, ask your nurse or doctor.          aspirin 81 MG chewable tablet Chew 1 mg by mouth daily.   atorvastatin 20 MG tablet Commonly known as: LIPITOR Take 1 tablet by mouth once daily   buPROPion 150 MG 12 hr tablet Commonly known as: ZYBAN 1 week before quit date, take 1 tab daily for 3 days and then 1 tab twice daily.   Farxiga 10 MG Tabs tablet Generic drug: dapagliflozin propanediol Take 1 tablet by mouth once daily   lisinopril 10 MG tablet Commonly known as: ZESTRIL Take 1 tablet (10 mg total) by mouth daily.   metFORMIN 1000 MG tablet Commonly known as: GLUCOPHAGE Take 1 tablet (1,000 mg total) by mouth 2 (two) times daily with a meal.   OneTouch Verio test strip Generic drug: glucose blood Check blood sugar 3 times daily.  DX E11.9   pioglitazone 30 MG tablet Commonly known as: ACTOS Take 1 tablet by mouth once daily          REVIEW OF SYSTEMS: A comprehensive ROS was conducted  with the patient and is negative except as per HPI    OBJECTIVE:  VS: BP 122/70 (BP Location: Left Arm, Patient Position: Sitting, Cuff Size: Normal)   Pulse 81   Ht '5\' 8"'$  (1.727 m)   Wt 167 lb 6.4 oz (75.9 kg)   SpO2 98%   BMI 25.45 kg/m   Wt Readings from Last 3 Encounters:  06/20/20 167 lb 2 oz (75.8 kg)  04/27/20 173 lb 12.8 oz (78.8 kg)  04/15/20 170 lb 6 oz (77.3 kg)     EXAM: General: Pt appears well and is in NAD  Neck: General: Supple without adenopathy. Thyroid: Thyroid size normal.  No goiter or nodules appreciated. No thyroid bruit.  Chest:  Clear with good BS bilatally No evidence of gynecomastia  Heart: Auscultation: RRR.  Abdomen: Normoactive bowel sounds, soft, nontender, without masses or organomegaly palpable  Genital: Normal  penile exam, right testicular size 20 mL, unable to palpate the left testicle  Extremities:  BL LE: No pretibial edema normal ROM and strength.  Skin: Hair: Texture and amount normal with gender appropriate distribution Skin Inspection: No rashes Skin Palpation: Skin temperature, texture, and thickness normal to palpation  Neuro:  DTRs: 2+ and symmetric in UE without delay in relaxation phase  Mental Status: Judgment, insight: Intact Orientation: Oriented to time, place, and person Mood and affect: No depression, anxiety, or agitation     DATA REVIEWED: Results for TEAGUN, TIVNAN (MRN [id number]) as of 09/21/2020 16:40  Ref. Range 04/19/2020 09:17 05/05/2020 08:43 06/20/2020 11:12  LH Latest Ref Range: 1.50 - 9.30 mIU/mL  42.15 (H)   FSH Latest Ref Range: 1.4 - 18.1 mIU/ML  60.4 (H)   Glucose Latest Ref Range: 70 - 99 mg/dL 144 (H)  153 (H)  Hemoglobin A1C Latest Ref Range: 4.6 - 6.5 %   7.6 (H)  Sex Horm Binding Glob, Serum Latest Ref Range: 22 - 77 nmol/L   63  Testosterone Latest Ref Range: 250 - 827 ng/dL 154.86 (L)  123 (L)  Testosterone, Bioavailable Latest Ref Range: 110.0 - 575.0 ng/dL   Pend  Testosterone Free Latest Ref Range: 46.0 - 224.0 pg/mL   See below  TSH Latest Ref Range: 0.35 - 4.50 uIU/mL 2.38        ASSESSMENT/PLAN/RECOMMENDATIONS:   Primary hypogonadism:  -Based on his previous LH, FSH, and testosterone testing it appears that he has primary hypogonadism -I am going to repeat his labs in the fasting status at 8 AM -We discussed side effects of testosterone therapy such as erythropoiesis, as well as increased  prostate cancer risk.  - We also discussed there is a possibility of increased cardiovascular risk associated with testosterone use. -We will proceed with ACTH, cortisol, testosterone, LH, IGF-I, prolactin, TFTs checks  2.  Weight loss:   -This is the main concern for the patient today -I am not certain that his weight loss has anything to do with his  primary hypogonadism, we discussed other endocrine differential to include hyperthyroidism, as well as adrenal insufficiency  -I also explained to the patient that making lifestyle changes could be the reason for weight loss as well as Jardiance.  Follow-up in 4 months Patient will return for labs  Signed electronically by: Mack Guise, MD  [facility]  [facility] [address] [place], [place] [address] Phone: [phone] FAX: [phone]   CC: Shelda Pal, [address] [place] [address] Phone: [phone]  Fax: [phone]   Return to Endocrinology clinic as below: Future Appointments  Date Time Provider [place]  09/20/2020  1:40 PM Myleen Brailsford, Melanie Crazier, MD [facility] [place]

## 2020-09-21 ENCOUNTER — Encounter: Payer: Self-pay | Admitting: Internal Medicine

## 2020-09-22 ENCOUNTER — Other Ambulatory Visit: Payer: Self-pay

## 2020-09-22 ENCOUNTER — Other Ambulatory Visit (INDEPENDENT_AMBULATORY_CARE_PROVIDER_SITE_OTHER): Payer: BC Managed Care – PPO

## 2020-09-22 DIAGNOSIS — R634 Abnormal weight loss: Secondary | ICD-10-CM

## 2020-09-22 DIAGNOSIS — R7989 Other specified abnormal findings of blood chemistry: Secondary | ICD-10-CM | POA: Diagnosis not present

## 2020-09-22 LAB — CBC WITH DIFFERENTIAL/PLATELET
Basophils Absolute: 0 10*3/uL (ref 0.0–0.1)
Basophils Relative: 0.4 % (ref 0.0–3.0)
Eosinophils Absolute: 0.1 10*3/uL (ref 0.0–0.7)
Eosinophils Relative: 1.8 % (ref 0.0–5.0)
HCT: 45.7 % (ref 39.0–52.0)
Hemoglobin: 15.7 g/dL (ref 13.0–17.0)
Lymphocytes Relative: 37 % (ref 12.0–46.0)
Lymphs Abs: 2 10*3/uL (ref 0.7–4.0)
MCHC: 34.4 g/dL (ref 30.0–36.0)
MCV: 92.4 fl (ref 78.0–100.0)
Monocytes Absolute: 0.3 10*3/uL (ref 0.1–1.0)
Monocytes Relative: 6.4 % (ref 3.0–12.0)
Neutro Abs: 2.9 10*3/uL (ref 1.4–7.7)
Neutrophils Relative %: 54.4 % (ref 43.0–77.0)
Platelets: 113 10*3/uL — ABNORMAL LOW (ref 150.0–400.0)
RBC: 4.94 Mil/uL (ref 4.22–5.81)
RDW: 14.1 % (ref 11.5–15.5)
WBC: 5.4 10*3/uL (ref 4.0–10.5)

## 2020-09-22 LAB — COMPREHENSIVE METABOLIC PANEL
ALT: 20 U/L (ref 0–53)
AST: 18 U/L (ref 0–37)
Albumin: 4.4 g/dL (ref 3.5–5.2)
Alkaline Phosphatase: 44 U/L (ref 39–117)
BUN: 21 mg/dL (ref 6–23)
CO2: 24 mEq/L (ref 19–32)
Calcium: 9.7 mg/dL (ref 8.4–10.5)
Chloride: 101 mEq/L (ref 96–112)
Creatinine, Ser: 0.78 mg/dL (ref 0.40–1.50)
GFR: 95.93 mL/min (ref >60.00)
Glucose, Bld: 156 mg/dL — ABNORMAL HIGH (ref 70–99)
Potassium: 4 mEq/L (ref 3.5–5.1)
Sodium: 137 mEq/L (ref 135–145)
Total Bilirubin: 0.7 mg/dL (ref 0.2–1.2)
Total Protein: 7 g/dL (ref 6.0–8.3)

## 2020-09-22 LAB — TSH: TSH: 2.36 u[IU]/mL (ref 0.35–5.50)

## 2020-09-22 LAB — LUTEINIZING HORMONE: LH: 40.67 m[IU]/mL — ABNORMAL HIGH (ref 1.50–9.30)

## 2020-09-22 LAB — CORTISOL: Cortisol, Plasma: 23.5 ug/dL

## 2020-09-22 LAB — T4, FREE: Free T4: 1.14 ng/dL (ref 0.60–1.60)

## 2020-09-23 ENCOUNTER — Telehealth: Payer: Self-pay | Admitting: Internal Medicine

## 2020-09-23 DIAGNOSIS — D696 Thrombocytopenia, unspecified: Secondary | ICD-10-CM | POA: Insufficient documentation

## 2020-09-23 DIAGNOSIS — E221 Hyperprolactinemia: Secondary | ICD-10-CM

## 2020-09-23 NOTE — Telephone Encounter (Signed)
I have discussed lab results with the patient and his spouse on 09/23/2020 at 60   His prolactin is slightly elevated, his LH remains elevated  Elevated LH is most likely secondary to primary hypogonadism  Testosterone and other pituitary hormones are pending  We will proceed with pituitary MRI I am also going to refer him to hematology for unexplained weight loss as well as thrombocytopenia     Results for SARAH, PURK (MRN [id number]) as of 09/23/2020 18:06  Ref. Range 09/22/2020 08:44  Sodium Latest Ref Range: 135 - 145 mEq/L 137  Potassium Latest Ref Range: 3.5 - 5.1 mEq/L 4.0  Chloride Latest Ref Range: 96 - 112 mEq/L 101  CO2 Latest Ref Range: 19 - 32 mEq/L 24  Glucose Latest Ref Range: 70 - 99 mg/dL 156 (H)  BUN Latest Ref Range: 6 - 23 mg/dL 21  Creatinine Latest Ref Range: 0.40 - 1.50 mg/dL 0.78  Calcium Latest Ref Range: 8.4 - 10.5 mg/dL 9.7  Alkaline Phosphatase Latest Ref Range: 39 - 117 U/L 44  Albumin Latest Ref Range: 3.5 - 5.2 g/dL 4.4  AST Latest Ref Range: 0 - 37 U/L 18  ALT Latest Ref Range: 0 - 53 U/L 20  Total Protein Latest Ref Range: 6.0 - 8.3 g/dL 7.0  Total Bilirubin Latest Ref Range: 0.2 - 1.2 mg/dL 0.7  GFR Latest Ref Range: >60.00 mL/min 95.93  WBC Latest Ref Range: 4.0 - 10.5 K/uL 5.4  RBC Latest Ref Range: 4.22 - 5.81 Mil/uL 4.94  Hemoglobin Latest Ref Range: 13.0 - 17.0 g/dL 15.7  HCT Latest Ref Range: 39.0 - 52.0 % 45.7  MCV Latest Ref Range: 78.0 - 100.0 fl 92.4  MCHC Latest Ref Range: 30.0 - 36.0 g/dL 34.4  RDW Latest Ref Range: 11.5 - 15.5 % 14.1  Platelets Latest Ref Range: 150.0 - 400.0 K/uL 113.0 (L)  Neutrophils Latest Ref Range: 43.0 - 77.0 % 54.4  Lymphocytes Latest Ref Range: 12.0 - 46.0 % 37.0  Monocytes Relative Latest Ref Range: 3.0 - 12.0 % 6.4  Eosinophil Latest Ref Range: 0.0 - 5.0 % 1.8  Basophil Latest Ref Range: 0.0 - 3.0 % 0.4  NEUT# Latest Ref Range: 1.4 - 7.7 K/uL 2.9  Lymphocyte # Latest Ref Range: 0.7 - 4.0 K/uL 2.0   Monocyte # Latest Ref Range: 0.1 - 1.0 K/uL 0.3  Eosinophils Absolute Latest Ref Range: 0.0 - 0.7 K/uL 0.1  Basophils Absolute Latest Ref Range: 0.0 - 0.1 K/uL 0.0  Cortisol, Plasma Latest Units: ug/dL 23.5  LH Latest Ref Range: 1.50 - 9.30 mIU/mL 40.67 (H)  Prolactin Latest Ref Range: 2.0 - 18.0 ng/mL 22.0 (H)  TSH Latest Ref Range: 0.35 - 5.50 uIU/mL 2.36  T4,Free(Direct) Latest Ref Range: 0.60 - 1.60 ng/dL 1.14

## 2020-09-27 LAB — INSULIN-LIKE GROWTH FACTOR
IGF-I, LC/MS: 104 ng/mL (ref 41–279)
Z-Score (Male): -0.4 SD (ref ?–2.0)

## 2020-09-27 LAB — TESTOSTERONE, TOTAL, LC/MS/MS: Testosterone, Total, LC-MS-MS: 135 ng/dL — ABNORMAL LOW (ref 250–1100)

## 2020-09-27 LAB — ACTH: C206 ACTH: 104 pg/mL — ABNORMAL HIGH (ref 6–50)

## 2020-09-27 LAB — PROLACTIN: Prolactin: 22 ng/mL — ABNORMAL HIGH (ref 2.0–18.0)

## 2020-09-28 ENCOUNTER — Telehealth: Payer: Self-pay | Admitting: *Deleted

## 2020-09-28 NOTE — Telephone Encounter (Signed)
Per referral Dr. Janalyn Harder - gave patient upcoming appointments - confirmed - mailed welcome packet with calendar

## 2020-10-02 ENCOUNTER — Telehealth: Payer: Self-pay | Admitting: Internal Medicine

## 2020-10-02 DIAGNOSIS — E270 Other adrenocortical overactivity: Secondary | ICD-10-CM

## 2020-10-02 NOTE — Telephone Encounter (Signed)
Please call the pt and schedule him for "nurse visit " for cosyntropin stimulation test ASAP     Thanks  Abby Nena Jordan, MD  [facility]  [facility] [address] [place], [place] [address] Phone: [phone] FAX: [phone]

## 2020-10-03 ENCOUNTER — Other Ambulatory Visit: Payer: Self-pay | Admitting: Family Medicine

## 2020-10-05 ENCOUNTER — Telehealth: Payer: Self-pay | Admitting: Internal Medicine

## 2020-10-08 ENCOUNTER — Ambulatory Visit (HOSPITAL_BASED_OUTPATIENT_CLINIC_OR_DEPARTMENT_OTHER): Payer: BC Managed Care – PPO

## 2020-10-13 NOTE — Telephone Encounter (Signed)
This Patient has only been seen at the [facility]. Do you want me to schedule Patient in [place]?

## 2020-10-14 ENCOUNTER — Telehealth: Payer: Self-pay | Admitting: Internal Medicine

## 2020-10-14 NOTE — Telephone Encounter (Signed)
Spoke (Dennis Stanton)--explaining the reasoning needing the consyntropin. which explain before by Dr. Kelton Pillar. Pt wife wanted to talk to the pt first and wait after the MRI result to make lab appt for cosyntropin in [place] office.

## 2020-10-14 NOTE — Telephone Encounter (Signed)
Called patient phone number, wife picked up phone. Asked if we could schedule a lab app and a nurse visit in the [place] office . Wife is wondering why he has to have this done and what is it for. Wife states that will talk with patient when he gets home and call back

## 2020-10-15 ENCOUNTER — Other Ambulatory Visit: Payer: Self-pay

## 2020-10-15 ENCOUNTER — Ambulatory Visit (HOSPITAL_BASED_OUTPATIENT_CLINIC_OR_DEPARTMENT_OTHER)
Admission: RE | Admit: 2020-10-15 | Discharge: 2020-10-15 | Disposition: A | Discharge status: Home / Self Care | Payer: BC Managed Care – PPO | Source: Ambulatory Visit | Attending: Internal Medicine | Admitting: Internal Medicine

## 2020-10-15 DIAGNOSIS — E221 Hyperprolactinemia: Secondary | ICD-10-CM | POA: Insufficient documentation

## 2020-10-15 MED ORDER — GADOBUTROL 1 MMOL/ML IV SOLN
7.5000 mL | Freq: Once | INTRAVENOUS | Status: AC | PRN
  Administered 2020-10-15: 7.5 mL via INTRAVENOUS

## 2020-10-17 ENCOUNTER — Other Ambulatory Visit: Payer: Self-pay | Admitting: Family Medicine

## 2020-10-17 DIAGNOSIS — E119 Type 2 diabetes mellitus without complications: Secondary | ICD-10-CM

## 2020-10-17 NOTE — Telephone Encounter (Signed)
Patient wife Dennis Stanton states that she will talk with patient and callback to schedule  test and injections.

## 2020-10-17 NOTE — Telephone Encounter (Signed)
Pt wife is wondering why he has to have this since its been a month. Pt's wife would like a call back from nurse

## 2020-10-17 NOTE — Telephone Encounter (Signed)
Can you contact patient why to explain in more detail why the cosyntropin is needed.

## 2020-10-18 NOTE — Telephone Encounter (Signed)
Patient wife Vicente Males states that she will talk with patient and callback to schedule  test and injections

## 2020-11-06 ENCOUNTER — Other Ambulatory Visit: Payer: Self-pay | Admitting: Family Medicine

## 2020-11-15 ENCOUNTER — Other Ambulatory Visit: Payer: Self-pay | Admitting: Family

## 2020-11-15 DIAGNOSIS — D696 Thrombocytopenia, unspecified: Secondary | ICD-10-CM

## 2020-11-16 ENCOUNTER — Other Ambulatory Visit: Payer: Self-pay

## 2020-11-16 ENCOUNTER — Inpatient Hospital Stay: Payer: BC Managed Care – PPO | Attending: Hematology & Oncology

## 2020-11-16 ENCOUNTER — Encounter: Payer: Self-pay | Admitting: Family

## 2020-11-16 ENCOUNTER — Inpatient Hospital Stay: Payer: BC Managed Care – PPO | Admitting: Family

## 2020-11-16 VITALS — BP 109/72 | HR 74 | Temp 98.2°F | Resp 18 | Wt 170.8 lb

## 2020-11-16 DIAGNOSIS — Z7982 Long term (current) use of aspirin: Secondary | ICD-10-CM | POA: Insufficient documentation

## 2020-11-16 DIAGNOSIS — E1140 Type 2 diabetes mellitus with diabetic neuropathy, unspecified: Secondary | ICD-10-CM | POA: Insufficient documentation

## 2020-11-16 DIAGNOSIS — Z79899 Other long term (current) drug therapy: Secondary | ICD-10-CM | POA: Insufficient documentation

## 2020-11-16 DIAGNOSIS — D72819 Decreased white blood cell count, unspecified: Secondary | ICD-10-CM | POA: Insufficient documentation

## 2020-11-16 DIAGNOSIS — F17210 Nicotine dependence, cigarettes, uncomplicated: Secondary | ICD-10-CM | POA: Insufficient documentation

## 2020-11-16 DIAGNOSIS — K219 Gastro-esophageal reflux disease without esophagitis: Secondary | ICD-10-CM | POA: Insufficient documentation

## 2020-11-16 DIAGNOSIS — Z7984 Long term (current) use of oral hypoglycemic drugs: Secondary | ICD-10-CM | POA: Insufficient documentation

## 2020-11-16 DIAGNOSIS — D696 Thrombocytopenia, unspecified: Secondary | ICD-10-CM

## 2020-11-16 DIAGNOSIS — Z8052 Family history of malignant neoplasm of bladder: Secondary | ICD-10-CM | POA: Diagnosis not present

## 2020-11-16 DIAGNOSIS — E785 Hyperlipidemia, unspecified: Secondary | ICD-10-CM | POA: Diagnosis not present

## 2020-11-16 DIAGNOSIS — E1165 Type 2 diabetes mellitus with hyperglycemia: Secondary | ICD-10-CM | POA: Insufficient documentation

## 2020-11-16 LAB — CBC WITH DIFFERENTIAL (CANCER CENTER ONLY)
Abs Immature Granulocytes: 0.02 10*3/uL (ref 0.00–0.07)
Basophils Absolute: 0 10*3/uL (ref 0.0–0.1)
Basophils Relative: 0 %
Eosinophils Absolute: 0.1 10*3/uL (ref 0.0–0.5)
Eosinophils Relative: 1 %
HCT: 45.8 % (ref 39.0–52.0)
Hemoglobin: 16 g/dL (ref 13.0–17.0)
Immature Granulocytes: 0 %
Lymphocytes Relative: 32 %
Lymphs Abs: 2.1 10*3/uL (ref 0.7–4.0)
MCH: 31.9 pg (ref 26.0–34.0)
MCHC: 34.9 g/dL (ref 30.0–36.0)
MCV: 91.2 fL (ref 80.0–100.0)
Monocytes Absolute: 0.4 10*3/uL (ref 0.1–1.0)
Monocytes Relative: 6 %
Neutro Abs: 4 10*3/uL (ref 1.7–7.7)
Neutrophils Relative %: 61 %
Platelet Count: 109 10*3/uL — ABNORMAL LOW (ref 150–400)
RBC: 5.02 MIL/uL (ref 4.22–5.81)
RDW: 13.9 % (ref 11.5–15.5)
WBC Count: 6.6 10*3/uL (ref 4.0–10.5)
nRBC: 0 % (ref 0.0–0.2)

## 2020-11-16 LAB — LACTATE DEHYDROGENASE: LDH: 149 U/L (ref 98–192)

## 2020-11-16 LAB — CMP (CANCER CENTER ONLY)
ALT: 33 U/L (ref 0–44)
AST: 26 U/L (ref 15–41)
Albumin: 4.7 g/dL (ref 3.5–5.0)
Alkaline Phosphatase: 45 U/L (ref 38–126)
Anion gap: 11 (ref 5–15)
BUN: 23 mg/dL (ref 8–23)
CO2: 24 mmol/L (ref 22–32)
Calcium: 9.9 mg/dL (ref 8.9–10.3)
Chloride: 101 mmol/L (ref 98–111)
Creatinine: 0.74 mg/dL (ref 0.61–1.24)
GFR, Estimated: >60 mL/min (ref >60)
Glucose, Bld: 233 mg/dL — ABNORMAL HIGH (ref 70–99)
Potassium: 4.2 mmol/L (ref 3.5–5.1)
Sodium: 136 mmol/L (ref 135–145)
Total Bilirubin: 0.7 mg/dL (ref 0.3–1.2)
Total Protein: 7.3 g/dL (ref 6.5–8.1)

## 2020-11-16 LAB — SAVE SMEAR(SSMR), FOR PROVIDER SLIDE REVIEW

## 2020-11-16 NOTE — Progress Notes (Signed)
Hematology/Oncology Consultation   Name: Rafel Garde      MRN: [id number]    Location: Room/bed info not found  Date: 11/16/2020 Time:3:16 PM   REFERRING PHYSICIAN: Ibtehal J. Shamleffer, MD  REASON FOR CONSULT: Thrombocytopenia    DIAGNOSIS: Thrombocytopenia   HISTORY OF PRESENT ILLNESS:  Mr. Peake is a very pleasant 60 yo [place] gentleman with a 7 year known history of mild thrombocytopenia.  His platelet count is stable at 109, WBC count 6.6, Hgb 16 and MCV 91.  He has had no issues with bleeding. No bruising or petechiae.  He notes random episodes of fatigue and brain fog.   He has poorly controlled diabetes currently taking metformin, Farxiga and Actos.  His endocrinologist has recommended insulin but he prefers to try orals for now. They plan to do further evaluation with Cosyntropin stimulation testing.  Hgb A1c in May was 7.6.  Testosterone level has been low.  He does smoke 10 cigarettes a day. He had his routine chest CT in May showed emphysema.  No recreational drug use. He has the occasional glass of wine with dinner.  He is taking 1 baby aspirin daily and notes easy bruising on his arms and legs.  He has a good appetite and has been eating healthier, lower carb in an effort to better control his blood sugars. He is staying well hydrated. His weight is stable at 174 lbs.   No history of thyroid disease.  No fever, chills, n/v, cough, rash, dizziness, SOB, chest pain, palpitations, abdominal pain or changes in bowel or bladder habits.  He notes flatus. No bloating.  No personal history of cancer. His father had history of kidney/ureter/bladder cancer.  He has neuropathy in his feet that comes and goes.  No swelling in his extremities.  No falls or syncope to report.  He is originally from [place] and moved to [place] years ago before ending up in [place] in the early 2000's.  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 He was  exposed to ammonia and other cleaning chemicals during his years there.  He is married with one daughter and a sweet puppy dog that he enjoys walking daily.  He does walking several miles on his treadmill daily for exercise.   ROS: All other 10 point review of systems is negative.   PAST MEDICAL HISTORY:   Past Medical History:  Diagnosis Date   Allergy    Diabetes mellitus without complication (HCC)    GERD (gastroesophageal reflux disease)    Hyperlipidemia     ALLERGIES: No Known Allergies    MEDICATIONS:  Current Outpatient Medications on File Prior to Visit  Medication Sig Dispense Refill   aspirin 81 MG chewable tablet Chew 1 mg by mouth daily.     atorvastatin (LIPITOR) 20 MG tablet Take 1 tablet by mouth once daily 90 tablet 0   buPROPion (ZYBAN) 150 MG 12 hr tablet 1 week before quit date, take 1 tab daily for 3 days and then 1 tab twice daily. 60 tablet 2   FARXIGA 10 MG TABS tablet Take 1 tablet by mouth once daily 90 tablet 0   glucose blood (ONETOUCH VERIO) test strip Check blood sugar 3 times daily.  DX E11.9 100 each 3   lisinopril (ZESTRIL) 10 MG tablet Take 1 tablet by mouth once daily 90 tablet 0   metFORMIN (GLUCOPHAGE) 1000 MG tablet TAKE 1 TABLET BY MOUTH TWICE DAILY WITH MEALS 180 tablet  0   pioglitazone (ACTOS) 30 MG tablet Take 1 tablet by mouth once daily 90 tablet 0   No current facility-administered medications on file prior to visit.     PAST SURGICAL HISTORY Past Surgical History:  Procedure Laterality Date   broken elbow Right     FAMILY HISTORY: Family History  Problem Relation Age of Onset   Bladder Cancer Father    Diabetes Mother    Heart disease Mother    Hypertension Mother    Diabetes Sister    Colon cancer Neg Hx    Esophageal cancer Neg Hx    Rectal cancer Neg Hx    Stomach cancer Neg Hx     SOCIAL HISTORY:  reports that he has been smoking cigarettes. He has a 20.00 pack-year smoking history. He has never used smokeless  tobacco. He reports current alcohol use. He reports that he does not use drugs.  PERFORMANCE STATUS: The patient's performance status is 1 - Symptomatic but completely ambulatory  PHYSICAL EXAM: Most Recent Vital Signs: There were no vitals taken for this visit. BP 109/72 (BP Location: Right Arm, Patient Position: Sitting)   Pulse 74   Temp 98.2 F (36.8 C) (Oral)   Resp 18   Wt 170 lb 12.8 oz (77.5 kg)   SpO2 98%   BMI 25.97 kg/m   General Appearance:    Alert, cooperative, no distress, appears stated age  Head:    Normocephalic, without obvious abnormality, atraumatic  Eyes:    PERRL, conjunctiva/corneas clear, EOM's intact, fundi    benign, both eyes             Throat:   Lips, mucosa, and tongue normal; teeth and gums normal  Neck:   Supple, symmetrical, trachea midline, no adenopathy;       thyroid:  No enlargement/tenderness/nodules; no carotid   bruit or JVD  Back:     Symmetric, no curvature, ROM normal, no CVA tenderness  Lungs:     Clear to auscultation bilaterally, respirations unlabored  Chest wall:    No tenderness or deformity  Heart:    Regular rate and rhythm, S1 and S2 normal, no murmur, rub   or gallop  Abdomen:     Soft, non-tender, bowel sounds active all four quadrants,    no masses, no organomegaly        Extremities:   Extremities normal, atraumatic, no cyanosis or edema  Pulses:   2+ and symmetric all extremities  Skin:   Skin color, texture, turgor normal, no rashes or lesions  Lymph nodes:   Cervical, supraclavicular, and axillary nodes normal  Neurologic:   CNII-XII intact. Normal strength, sensation and reflexes      throughout    LABORATORY DATA:  Results for orders placed or performed in visit on 11/16/20 (from the past 48 hour(s))  CBC with Differential ([place] Only)     Status: Abnormal   Collection Time: 11/16/20  2:46 PM  Result Value Ref Range   WBC Count 6.6 4.0 - 10.5 K/uL   RBC 5.02 4.22 - 5.81 MIL/uL   Hemoglobin 16.0  13.0 - 17.0 g/dL   HCT 45.8 39.0 - 52.0 %   MCV 91.2 80.0 - 100.0 fL   MCH 31.9 26.0 - 34.0 pg   MCHC 34.9 30.0 - 36.0 g/dL   RDW 13.9 11.5 - 15.5 %   Platelet Count 109 (L) 150 - 400 K/uL    Comment: PLATELET COUNT CONFIRMED BY SMEAR   nRBC  0.0 0.0 - 0.2 %   Neutrophils Relative % 61 %   Neutro Abs 4.0 1.7 - 7.7 K/uL   Lymphocytes Relative 32 %   Lymphs Abs 2.1 0.7 - 4.0 K/uL   Monocytes Relative 6 %   Monocytes Absolute 0.4 0.1 - 1.0 K/uL   Eosinophils Relative 1 %   Eosinophils Absolute 0.1 0.0 - 0.5 K/uL   Basophils Relative 0 %   Basophils Absolute 0.0 0.0 - 0.1 K/uL   Immature Granulocytes 0 %   Abs Immature Granulocytes 0.02 0.00 - 0.07 K/uL    Comment: Performed at [facility] at [facility], [address] [place], [place] [address]  CMP ([place] only)     Status: Abnormal   Collection Time: 11/16/20  2:46 PM  Result Value Ref Range   Sodium 136 135 - 145 mmol/L   Potassium 4.2 3.5 - 5.1 mmol/L   Chloride 101 98 - 111 mmol/L   CO2 24 22 - 32 mmol/L   Glucose, Bld 233 (H) 70 - 99 mg/dL    Comment: Glucose reference range applies only to samples taken after fasting for at least 8 hours.   BUN 23 8 - 23 mg/dL   Creatinine 0.74 0.61 - 1.24 mg/dL   Calcium 9.9 8.9 - 10.3 mg/dL   Total Protein 7.3 6.5 - 8.1 g/dL   Albumin 4.7 3.5 - 5.0 g/dL   AST 26 15 - 41 U/L   ALT 33 0 - 44 U/L   Alkaline Phosphatase 45 38 - 126 U/L   Total Bilirubin 0.7 0.3 - 1.2 mg/dL   GFR, Estimated >60 >60 mL/min    Comment: (NOTE) Calculated using the CKD-EPI Creatinine Equation (2021)    Anion gap 11 5 - 15    Comment: Performed at [facility], [place] [address], [place], [place] [address]  Save Smear [facility])     Status: None   Collection Time: 11/16/20  2:46 PM  Result Value Ref Range   Smear Review SMEAR STAINED AND AVAILABLE FOR REVIEW     Comment: Performed at [facility] at [facility], [address], [place], [place] [address]      RADIOGRAPHY: No results found.     PATHOLOGY: None  ASSESSMENT/PLAN: Mr. Grant is a very pleasant 86 yo [place] gentleman with a 7 year known history of mild thrombocytopenia.  Labs and blood smear reviewed with Dr. Marin Olp. White and red cells appear well developed. Platelets noted to be slightly enlarged. No other abnormality noted.  We will get an abdominal US next week to assess liver and spleen.  We will continue to follow along with him and plan to see again in 6 months.   All questions were answered. The patient knows to call the clinic with any problems, questions or concerns. We can certainly see the patient much sooner if necessary.  The patient was discussed with Dr. Marin Olp and he is in agreement with the aforementioned.   Lottie Dawson, NP

## 2020-11-17 ENCOUNTER — Telehealth: Payer: Self-pay | Admitting: *Deleted

## 2020-11-17 NOTE — Telephone Encounter (Signed)
Per 11/17/20 los - called and lvm of upcoming appointments - requested call back to confirm - mailed calendar

## 2020-11-23 ENCOUNTER — Other Ambulatory Visit: Payer: Self-pay

## 2020-11-23 ENCOUNTER — Ambulatory Visit (HOSPITAL_BASED_OUTPATIENT_CLINIC_OR_DEPARTMENT_OTHER)
Admission: RE | Admit: 2020-11-23 | Discharge: 2020-11-23 | Disposition: A | Discharge status: Home / Self Care | Payer: BC Managed Care – PPO | Source: Ambulatory Visit | Attending: Family | Admitting: Family

## 2020-11-23 DIAGNOSIS — D696 Thrombocytopenia, unspecified: Secondary | ICD-10-CM | POA: Insufficient documentation

## 2020-11-29 ENCOUNTER — Telehealth: Payer: Self-pay

## 2020-11-29 NOTE — Telephone Encounter (Signed)
-----   Message from Celso Amy, NP sent at 11/29/2020 10:04 AM EDT ----- [place] normal!!! No enlarged liver or spleen. Thank you.    ----- Message ----- From: Interface, Rad Results In Sent: 11/25/2020   1:11 PM EDT To: Celso Amy, NP

## 2020-11-29 NOTE — Telephone Encounter (Signed)
Called and LM informing patient of u/s results (ok per DPR), requested a call back with any questions or concerns at this time.

## 2021-01-02 ENCOUNTER — Other Ambulatory Visit: Payer: Self-pay | Admitting: Family Medicine

## 2021-01-24 ENCOUNTER — Ambulatory Visit: Payer: BC Managed Care – PPO | Admitting: Internal Medicine

## 2021-01-29 ENCOUNTER — Other Ambulatory Visit: Payer: Self-pay | Admitting: Family Medicine

## 2021-01-29 DIAGNOSIS — E119 Type 2 diabetes mellitus without complications: Secondary | ICD-10-CM

## 2021-02-05 ENCOUNTER — Other Ambulatory Visit: Payer: Self-pay | Admitting: Family Medicine

## 2021-03-03 ENCOUNTER — Encounter: Payer: Self-pay | Admitting: Family Medicine

## 2021-03-03 ENCOUNTER — Ambulatory Visit: Payer: BC Managed Care – PPO | Admitting: Family Medicine

## 2021-03-03 VITALS — BP 116/64 | HR 93 | Temp 98.0°F | Resp 16 | Ht 68.0 in | Wt 166.8 lb

## 2021-03-03 DIAGNOSIS — R634 Abnormal weight loss: Secondary | ICD-10-CM

## 2021-03-03 DIAGNOSIS — E1165 Type 2 diabetes mellitus with hyperglycemia: Secondary | ICD-10-CM

## 2021-03-03 DIAGNOSIS — E221 Hyperprolactinemia: Secondary | ICD-10-CM

## 2021-03-03 DIAGNOSIS — D696 Thrombocytopenia, unspecified: Secondary | ICD-10-CM

## 2021-03-03 DIAGNOSIS — R143 Flatulence: Secondary | ICD-10-CM

## 2021-03-03 LAB — MICROALBUMIN / CREATININE URINE RATIO
Creatinine,U: 29.3 mg/dL
Microalb Creat Ratio: 2.4 mg/g (ref 0.0–30.0)
Microalb, Ur: <0.7 mg/dL (ref 0.0–1.9)

## 2021-03-03 LAB — HEMOGLOBIN A1C: Hgb A1c MFr Bld: 9 % — ABNORMAL HIGH (ref 4.6–6.5)

## 2021-03-03 NOTE — Progress Notes (Signed)
Chief Complaint  Patient presents with   Follow-up    Weight loss     Subjective: Patient is a 61 y.o. male here for f/u. Here w wife.   Pt following up for wt loss. Has lost around 40 lbs unintentionally since moving down here.  He saw oncology with an unremarkable work-up.  He also saw endocrinology.  After an elevated prolactin and ACTH, he had an MRI of his brain that showed an unremarkable pituitary gland with no other abnormalities.  He is having fatigue in addition to abdominal discomfort and flatulence.  He is wife did some research and were wondering if celiac disease could be contributing.  He does not check his sugars routinely, his A1c has increased.  Past Medical History:  Diagnosis Date   Allergy    Diabetes mellitus without complication (HCC)    GERD (gastroesophageal reflux disease)    Hyperlipidemia     Objective: BP 116/64    Pulse 93    Temp 98 F (36.7 C)    Resp 16    Ht 5\' 8"  (1.727 m)    Wt 166 lb 12.8 oz (75.7 kg)    SpO2 97%    BMI 25.36 kg/m  General: Awake, appears stated age Heart: RRR, no LE edema Lungs: CTAB, no rales, wheezes or rhonchi. No accessory muscle use Abdomen: Bowel sounds present, soft, nontender, nondistended Psych: Age appropriate judgment and insight, normal affect and mood  Assessment and Plan: Unintentional weight loss - Plan: Gliadin antibodies, serum, Tissue transglutaminase, IgA, Reticulin Antibody, IgA w reflex titer, Anti-islet cell antibody  Flatulence  Type 2 diabetes mellitus with hyperglycemia, without long-term current use of insulin (HCC) - Plan: Hemoglobin A1c, Microalbumin / creatinine urine ratio  Hyperprolactinemia (HCC), Chronic  Thrombocytopenia (HCC), Chronic  Chronic, not controlled.  We will check blood work for celiac disease in addition to an islet cell antibody.  We will follow-up with some other labs as well.  I did recommend he reach out to the endocrinology team for follow-up testing for the elevated  ACTH test. Follow-up with me pending his results. The patient and his spouse voiced understanding and agreement to the plan.  [place], DO 03/03/21  11:52 AM

## 2021-03-03 NOTE — Patient Instructions (Addendum)
Please reach out to Dr. Quin Hoop office regarding your lab testing.  Give [place] 2-3 business days to get the results of your labs back.   Keep the diet clean and stay active.  Consider Gas-X or Bean-O for gas.   Let us know if you need anything.

## 2021-03-06 LAB — GLIADIN ANTIBODIES, SERUM
Gliadin IgA: <1 U/mL
Gliadin IgG: <1 U/mL

## 2021-03-06 LAB — TISSUE TRANSGLUTAMINASE, IGA: (tTG) Ab, IgA: <1 U/mL

## 2021-03-08 LAB — RETICULIN ANTIBODIES, IGA W TITER: Reticulin Ab, IgA: NEGATIVE titer (ref <2.5)

## 2021-03-08 LAB — ANTI-ISLET CELL ANTIBODY: Islet Cell Ab: NEGATIVE

## 2021-03-10 ENCOUNTER — Telehealth: Payer: Self-pay | Admitting: Family Medicine

## 2021-03-10 NOTE — Telephone Encounter (Signed)
The patients wife called to get most recent lab results. They were sent to my  chart, but they do not do my chart. Did inform of results below per PCP instructions. They will call back next week to let PCP know what they want to do.     Most of your labs are normal, but your sugar is quite high. I would like to start you on a weekly shot, but if you don't want this, we can stick with a pill. Let me know what you'd like to do. I am waiting on one more result to return.

## 2021-03-13 NOTE — Telephone Encounter (Signed)
Pt's wife states they need his results faxed to endo. She states they received her labs instead. Please fax recent labs to [place], f #: [phone].

## 2021-03-13 NOTE — Telephone Encounter (Signed)
Faxed correct results/notes.

## 2021-04-03 ENCOUNTER — Other Ambulatory Visit: Payer: Self-pay | Admitting: Family Medicine

## 2021-04-25 ENCOUNTER — Telehealth: Payer: Self-pay | Admitting: *Deleted

## 2021-04-25 NOTE — Telephone Encounter (Signed)
Patient's wife left voice mail to cancel appointment and will call back to reschedule. ?

## 2021-05-01 ENCOUNTER — Other Ambulatory Visit: Payer: Self-pay | Admitting: Family Medicine

## 2021-05-01 DIAGNOSIS — E119 Type 2 diabetes mellitus without complications: Secondary | ICD-10-CM

## 2021-05-08 ENCOUNTER — Other Ambulatory Visit: Payer: Self-pay | Admitting: Family Medicine

## 2021-05-16 ENCOUNTER — Ambulatory Visit: Payer: BC Managed Care – PPO | Admitting: Family

## 2021-05-16 ENCOUNTER — Other Ambulatory Visit: Payer: BC Managed Care – PPO

## 2021-06-19 ENCOUNTER — Other Ambulatory Visit: Payer: Self-pay | Admitting: Family Medicine

## 2021-06-20 ENCOUNTER — Ambulatory Visit: Payer: BC Managed Care – PPO | Admitting: Family Medicine

## 2021-06-20 ENCOUNTER — Encounter: Payer: Self-pay | Admitting: Family Medicine

## 2021-06-20 VITALS — BP 106/68 | HR 76 | Temp 98.1°F | Ht 68.0 in | Wt 171.4 lb

## 2021-06-20 DIAGNOSIS — E1165 Type 2 diabetes mellitus with hyperglycemia: Secondary | ICD-10-CM

## 2021-06-20 DIAGNOSIS — B353 Tinea pedis: Secondary | ICD-10-CM | POA: Diagnosis not present

## 2021-06-20 DIAGNOSIS — Z720 Tobacco use: Secondary | ICD-10-CM

## 2021-06-20 LAB — COMPREHENSIVE METABOLIC PANEL
ALT: 21 U/L (ref 0–53)
AST: 17 U/L (ref 0–37)
Albumin: 4.6 g/dL (ref 3.5–5.2)
Alkaline Phosphatase: 54 U/L (ref 39–117)
BUN: 26 mg/dL — ABNORMAL HIGH (ref 6–23)
CO2: 23 mEq/L (ref 19–32)
Calcium: 9.4 mg/dL (ref 8.4–10.5)
Chloride: 101 mEq/L (ref 96–112)
Creatinine, Ser: 1.05 mg/dL (ref 0.40–1.50)
GFR: 75.96 mL/min (ref >60.00)
Glucose, Bld: 175 mg/dL — ABNORMAL HIGH (ref 70–99)
Potassium: 4.3 mEq/L (ref 3.5–5.1)
Sodium: 134 mEq/L — ABNORMAL LOW (ref 135–145)
Total Bilirubin: 0.7 mg/dL (ref 0.2–1.2)
Total Protein: 7 g/dL (ref 6.0–8.3)

## 2021-06-20 LAB — HEMOGLOBIN A1C: Hgb A1c MFr Bld: 8.6 % — ABNORMAL HIGH (ref 4.6–6.5)

## 2021-06-20 MED ORDER — RYBELSUS 7 MG PO TABS: 7.0000 mg | ORAL_TABLET | Freq: Every day | ORAL | 2 refills | Status: DC

## 2021-06-20 MED ORDER — KETOCONAZOLE 2 % EX CREA: 1.0000 "application " | TOPICAL_CREAM | Freq: Every day | CUTANEOUS | 0 refills | Status: AC

## 2021-06-20 MED ORDER — BUPROPION HCL ER (SMOKING DET) 150 MG PO TB12: ORAL_TABLET | ORAL | 2 refills | Status: DC

## 2021-06-20 NOTE — Progress Notes (Signed)
Subjective:  ? ?Chief Complaint  ?Patient presents with  ? Follow-up  ?  Needs CT Chest ordered and discuss  ? ? ?Dennis Stanton is a 61 y.o. male here for follow-up of diabetes.  Here w wife.  ?Noha does not routinely monitor his sugars.  ?Patient does not require insulin.   ?Medications include: Metformin 1000 mg bid, Actos 30 mg/d, Farxiga 10 mg/d ?Diet is fair.  ?Exercise: some walking, active in yard. ?No Cp or SOB. ? ?Pt worried about wt loss. Used to be 199 lbs in 2018, has been around 170 lbs over past 2 years. Has seen endo and heme/onc, CCS done in 2018 w 5 yr f/u. Diet/exercise as above.  ? ?Past Medical History:  ?Diagnosis Date  ? Allergy   ? Diabetes mellitus without complication ([place])   ? GERD (gastroesophageal reflux disease)   ? Hyperlipidemia   ?  ? ?Related testing: ?Retinal exam: Due ?Pneumovax: done ? ?Objective:  ?BP 106/68   Pulse 76   Temp 98.1 ?F (36.7 ?C) (Oral)   Ht '5\' 8"'$  (1.727 m)   Wt 171 lb 6 oz (77.7 kg)   SpO2 99%   BMI 26.06 kg/m?  ?General:  Well developed, well nourished, in no apparent distress ?Skin: Macerated tissue between 4/5 digit on left foot; warm, no pallor or diaphoresis ?Head:  Normocephalic, atraumatic ?Lungs:  CTAB, no access msc use ?Cardio:  RRR, no bruits, no LE edema ?Musculoskeletal:  Symmetrical muscle groups noted without atrophy or deformity ?Neuro:  Sensation intact to pinprick on feet ?Psych: Age appropriate judgment and insight ? ?Assessment:  ? ?Uncontrolled type 2 diabetes mellitus with hyperglycemia (HCC) - Plan: Semaglutide (RYBELSUS) 7 MG TABS, Hemoglobin A1c, Comprehensive metabolic panel ? ?Tobacco abuse - Plan: buPROPion (ZYBAN) 150 MG 12 hr tablet ? ?Tinea pedis of left foot - Plan: ketoconazole (NIZORAL) 2 % cream  ? ?Plan:  ? ?Chronic, not controlled.  Check A1c today though likely more to be under 7.5.  Add Rybelsus 7 mg daily.  Continue metformin 1000 mg twice daily, Actos 30 mg daily, Farxiga 10 mg daily.  Counseled on diet and  exercise.  I did reassure him about his weight. Due for eye exam, he will reach out to sched at his eye doc's.  ?Counseled on cessation.  Refill bupropion as above. ?6 weeks ketoconazole cream on left foot. ?F/u in 3 mo. ?The patient and his spouse voiced understanding and agreement to the plan. ? ?Shelda Pal, DO ?06/20/21 ?12:18 PM ? ?

## 2021-06-20 NOTE — Patient Instructions (Addendum)
Give [place] 2-3 business days to get the results of your labs back.  ? ?Keep the diet clean and stay active. ? ?Keep checking your sugars.  ? ?Use warm compresses for your eye.  ? ?Let us know if you need anything. ?

## 2021-06-23 ENCOUNTER — Other Ambulatory Visit: Payer: Self-pay | Admitting: Family Medicine

## 2021-07-03 ENCOUNTER — Other Ambulatory Visit: Payer: Self-pay | Admitting: Family Medicine

## 2021-07-11 ENCOUNTER — Telehealth: Payer: Self-pay

## 2021-07-11 NOTE — Telephone Encounter (Signed)
Patient's wife called in & stated that she received a phone call from our office in regards to scheduling a procedure. There is no documentation of a phone call, however it does look like patient is due for a colonoscopy. Patient's wife has been made aware & has declined scheduling a colonoscopy until she can speak with her husband about it. She has been advised to call back when ready to schedule.

## 2021-07-30 ENCOUNTER — Other Ambulatory Visit: Payer: Self-pay | Admitting: Family Medicine

## 2021-07-30 DIAGNOSIS — E119 Type 2 diabetes mellitus without complications: Secondary | ICD-10-CM

## 2021-08-07 ENCOUNTER — Other Ambulatory Visit: Payer: Self-pay | Admitting: Family Medicine

## 2021-08-26 LAB — HM DIABETES EYE EXAM

## 2021-08-29 ENCOUNTER — Encounter: Payer: Self-pay | Admitting: Family Medicine

## 2021-09-18 ENCOUNTER — Other Ambulatory Visit: Payer: Self-pay | Admitting: Family Medicine

## 2021-09-18 DIAGNOSIS — E1165 Type 2 diabetes mellitus with hyperglycemia: Secondary | ICD-10-CM

## 2021-10-09 ENCOUNTER — Other Ambulatory Visit: Payer: Self-pay | Admitting: Family Medicine

## 2021-10-18 ENCOUNTER — Ambulatory Visit: Payer: BC Managed Care – PPO | Admitting: Family Medicine

## 2021-10-18 ENCOUNTER — Encounter: Payer: Self-pay | Admitting: Family Medicine

## 2021-10-18 VITALS — BP 108/72 | HR 71 | Temp 97.8°F | Ht 69.0 in | Wt 172.2 lb

## 2021-10-18 DIAGNOSIS — E1165 Type 2 diabetes mellitus with hyperglycemia: Secondary | ICD-10-CM

## 2021-10-18 DIAGNOSIS — H6983 Other specified disorders of Eustachian tube, bilateral: Secondary | ICD-10-CM | POA: Diagnosis not present

## 2021-10-18 DIAGNOSIS — E785 Hyperlipidemia, unspecified: Secondary | ICD-10-CM

## 2021-10-18 DIAGNOSIS — I10 Essential (primary) hypertension: Secondary | ICD-10-CM

## 2021-10-18 LAB — LIPID PANEL
Cholesterol: 129 mg/dL (ref 0–200)
HDL: 49.1 mg/dL (ref >39.00)
LDL Cholesterol: 60 mg/dL (ref 0–99)
NonHDL: 79.86
Total CHOL/HDL Ratio: 3
Triglycerides: 99 mg/dL (ref 0.0–149.0)
VLDL: 19.8 mg/dL (ref 0.0–40.0)

## 2021-10-18 LAB — COMPREHENSIVE METABOLIC PANEL
ALT: 30 U/L (ref 0–53)
AST: 17 U/L (ref 0–37)
Albumin: 4.3 g/dL (ref 3.5–5.2)
Alkaline Phosphatase: 61 U/L (ref 39–117)
BUN: 19 mg/dL (ref 6–23)
CO2: 27 mEq/L (ref 19–32)
Calcium: 9.6 mg/dL (ref 8.4–10.5)
Chloride: 99 mEq/L (ref 96–112)
Creatinine, Ser: 0.75 mg/dL (ref 0.40–1.50)
GFR: 96.34 mL/min (ref >60.00)
Glucose, Bld: 195 mg/dL — ABNORMAL HIGH (ref 70–99)
Potassium: 4.4 mEq/L (ref 3.5–5.1)
Sodium: 135 mEq/L (ref 135–145)
Total Bilirubin: 0.7 mg/dL (ref 0.2–1.2)
Total Protein: 7 g/dL (ref 6.0–8.3)

## 2021-10-18 LAB — HEMOGLOBIN A1C: Hgb A1c MFr Bld: 8.6 % — ABNORMAL HIGH (ref 4.6–6.5)

## 2021-10-18 MED ORDER — FLUTICASONE PROPIONATE 50 MCG/ACT NA SUSP: 2.0000 | Freq: Every day | NASAL | 2 refills | Status: DC

## 2021-10-18 NOTE — Progress Notes (Signed)
Subjective:   Chief Complaint  Patient presents with   Follow-up    Diabetes    Dennis Stanton is a 61 y.o. male here for follow-up of diabetes.   Dennis Stanton's self monitored glucose range is mid 100's.  Patient denies hypoglycemic reactions. He checks his glucose levels 2 time(s) per day. Patient does not require insulin.   Medications include: Metformin 1000 mg bid, Actos 30 mg/d, Rybelsus 7 mg/d, Farxiga 10 mg/d Diet is healthy.  Exercise: active in yard, walking  Hypertension Patient presents for hypertension follow up. He does not monitor home blood pressures. He is compliant with medication- lisinopril 10 mg/d. Patient has these side effects of medication: none Diet/exercise as above. No Cp or SOB.   Hyperlipidemia Patient presents for dyslipidemia follow up. Currently being treated with Lipitor 20 mg/d and compliance with treatment thus far has been good. He denies myalgias. Diet/exercise as above.  The patient is not known to have coexisting coronary artery disease.  Ear fullness B/l ear fullness. No pain, drainage, fevers, URI s/s's.   Past Medical History:  Diagnosis Date   Allergy    Diabetes mellitus without complication (HCC)    GERD (gastroesophageal reflux disease)    Hyperlipidemia      Related testing: Retinal exam: Done Pneumovax: done  Objective:  BP 108/72   Pulse 71   Temp 97.8 F (36.6 C) (Oral)   Ht '5\' 9"'$  (1.753 m)   Wt 172 lb 4 oz (78.1 kg)   SpO2 99%   BMI 25.44 kg/m  General:  Well developed, well nourished, in no apparent distress Ears: Canals are patent with scant wax, no otorrhea, TMs negative bilaterally Skin:  Warm, no pallor or diaphoresis Head:  Normocephalic, atraumatic Lungs:  CTAB, no access msc use Cardio:  RRR, no bruits, no LE edema Psych: Age appropriate judgment and insight  Assessment:   Uncontrolled type 2 diabetes mellitus with hyperglycemia (HCC) - Plan: Comprehensive metabolic panel, Lipid panel,  Hemoglobin A1c  Hyperlipidemia, unspecified hyperlipidemia type  Essential hypertension  Dysfunction of both eustachian tubes - Plan: fluticasone (FLONASE) 50 MCG/ACT nasal spray   Plan:   Chronic, uncontrolled.  For now he will continue metformin 1000 mg twice daily, Farxiga 10 mg daily, pioglitazone 30 mg daily, and Rybelsus 7 mg daily.  If slightly uncontrolled, will increase Rybelsus to 14 mg daily continue everything else.  If very uncontrolled, will increase this in addition to adding glyburide.  Counseled on diet and exercise. Chronic, probably stable.  Continue Lipitor 20 mg daily. Chronic, stable.  Continue lisinopril 10 mg daily. Start an intranasal corticosteroid, Flonase sent in. F/u pending the above work-up. The patient voiced understanding and agreement to the plan.  [place], DO 10/18/21 11:04 AM

## 2021-10-18 NOTE — Patient Instructions (Addendum)
Give [place] 2-3 business days to get the results of your labs back.   Keep the diet clean and stay active.  Continue to check your sugars.  I recommend getting the flu shot in mid October. This suggestion would change if the CDC comes out with a different recommendation.   Flonase (fluticasone); nasal spray that is over the counter. 2 sprays each nostril, once daily. Aim towards the same side eye when you spray.  There are available OTC, and the generic versions, which may be cheaper, are in parentheses. Show this to a pharmacist if you have trouble finding any of these items.  Let us know if you need anything.

## 2021-10-19 ENCOUNTER — Other Ambulatory Visit: Payer: Self-pay | Admitting: Family Medicine

## 2021-10-19 MED ORDER — GLYBURIDE 5 MG PO TABS: 5.0000 mg | ORAL_TABLET | Freq: Every day | ORAL | 3 refills | Status: DC

## 2021-10-20 ENCOUNTER — Other Ambulatory Visit: Payer: Self-pay | Admitting: Family Medicine

## 2021-10-20 DIAGNOSIS — E1165 Type 2 diabetes mellitus with hyperglycemia: Secondary | ICD-10-CM

## 2021-10-20 MED ORDER — RYBELSUS 7 MG PO TABS: 1.0000 | ORAL_TABLET | Freq: Every day | ORAL | 1 refills | Status: DC

## 2021-10-30 ENCOUNTER — Other Ambulatory Visit: Payer: Self-pay | Admitting: Family Medicine

## 2021-10-30 DIAGNOSIS — E119 Type 2 diabetes mellitus without complications: Secondary | ICD-10-CM

## 2021-11-06 ENCOUNTER — Other Ambulatory Visit: Payer: Self-pay | Admitting: Family Medicine

## 2021-12-17 ENCOUNTER — Other Ambulatory Visit: Payer: Self-pay | Admitting: Family Medicine

## 2021-12-19 ENCOUNTER — Encounter: Payer: Self-pay | Admitting: Gastroenterology

## 2022-01-22 ENCOUNTER — Ambulatory Visit: Payer: BC Managed Care – PPO | Admitting: Family Medicine

## 2022-01-22 ENCOUNTER — Encounter: Payer: Self-pay | Admitting: Family Medicine

## 2022-01-22 VITALS — BP 118/70 | HR 76 | Temp 98.2°F | Ht 68.0 in | Wt 191.4 lb

## 2022-01-22 DIAGNOSIS — E1165 Type 2 diabetes mellitus with hyperglycemia: Secondary | ICD-10-CM | POA: Diagnosis not present

## 2022-01-22 DIAGNOSIS — Z122 Encounter for screening for malignant neoplasm of respiratory organs: Secondary | ICD-10-CM | POA: Diagnosis not present

## 2022-01-22 LAB — HEMOGLOBIN A1C: Hgb A1c MFr Bld: 8.4 % — ABNORMAL HIGH (ref 4.6–6.5)

## 2022-01-22 NOTE — Progress Notes (Signed)
Subjective:   Chief Complaint  Patient presents with   Follow-up    DM followup    Dennis Stanton is a 61 y.o. male here for follow-up of diabetes.   Dennis Stanton's self monitored glucose range is low-mid 100's.  Patient denies hypoglycemic reactions. He checks his glucose levels 1 time per day. Patient does not require insulin.   Medications include: Actos 30 mg/d, Rybelsus 7 mg/d, Farxiga 10 mg/d, metformin 1000 mg twice daily, glyburide 5 mg daily Diet is healthy overall.  Exercise: walking No Cp or SOB.   Past Medical History:  Diagnosis Date   Allergy    Diabetes mellitus without complication (HCC)    GERD (gastroesophageal reflux disease)    Hyperlipidemia      Related testing: Retinal exam: Done Pneumovax: done  Objective:  BP 118/70 (BP Location: Left Arm, Patient Position: Sitting, Cuff Size: Normal)   Pulse 76   Temp 98.2 F (36.8 C) (Oral)   Ht '5\' 8"'$  (1.727 m)   Wt 191 lb 6 oz (86.8 kg)   SpO2 99%   BMI 29.10 kg/m  General:  Well developed, well nourished, in no apparent distress Skin:  Warm, no pallor or diaphoresis Lungs:  CTAB, no access msc use Cardio:  RRR, no bruits, no LE edema Psych: Age appropriate judgment and insight  Assessment:   Uncontrolled type 2 diabetes mellitus with hyperglycemia (HCC)   Plan:   Chronic, uncontrolled.  Continue glyburide 5 mg daily, metformin 1000 mg twice daily, Farxiga 10 mg daily, Rybelsus 7 mg daily, Actos 30 mg daily.  If not controlled, would uptitrate the Rybelsus and then maybe the Actos.  Would save glyburide for last before transitioning to insulin.  Continue to monitor sugars at home.  I would have him bring his glucose monitor to his next appointment if the A1c does not match what we expect from his home readings.  Counseled on diet and exercise. F/u in 3-6 mo. The patient voiced understanding and agreement to the plan.  [place], DO 01/22/22 11:21 AM

## 2022-01-22 NOTE — Patient Instructions (Addendum)
Give [place] 2-3 business days to get the results of your labs back.   Keep the diet clean and stay active.  If you do not hear anything about your referral in the next 1-2 weeks, call our office and ask for an update.  Please schedule your follow up colonoscopy.  Let us know if you need anything.

## 2022-01-23 ENCOUNTER — Other Ambulatory Visit: Payer: Self-pay | Admitting: Family Medicine

## 2022-01-23 MED ORDER — RYBELSUS 14 MG PO TABS: 14.0000 mg | ORAL_TABLET | Freq: Every day | ORAL | 2 refills | Status: DC

## 2022-01-29 ENCOUNTER — Other Ambulatory Visit: Payer: Self-pay | Admitting: Family Medicine

## 2022-01-29 DIAGNOSIS — E119 Type 2 diabetes mellitus without complications: Secondary | ICD-10-CM

## 2022-02-06 ENCOUNTER — Other Ambulatory Visit: Payer: Self-pay | Admitting: Family Medicine

## 2022-02-15 ENCOUNTER — Other Ambulatory Visit: Payer: Self-pay | Admitting: Family Medicine

## 2022-02-20 ENCOUNTER — Other Ambulatory Visit: Payer: Self-pay | Admitting: Family Medicine

## 2022-03-26 ENCOUNTER — Other Ambulatory Visit: Payer: Self-pay | Admitting: Family Medicine

## 2022-04-30 ENCOUNTER — Other Ambulatory Visit: Payer: Self-pay | Admitting: Family Medicine

## 2022-04-30 DIAGNOSIS — E119 Type 2 diabetes mellitus without complications: Secondary | ICD-10-CM

## 2022-05-14 ENCOUNTER — Other Ambulatory Visit: Payer: Self-pay | Admitting: Family Medicine

## 2022-05-28 ENCOUNTER — Other Ambulatory Visit: Payer: Self-pay | Admitting: Family Medicine

## 2022-07-04 ENCOUNTER — Other Ambulatory Visit: Payer: Self-pay | Admitting: Family Medicine

## 2022-08-09 ENCOUNTER — Other Ambulatory Visit: Payer: Self-pay | Admitting: Family Medicine

## 2022-08-11 ENCOUNTER — Other Ambulatory Visit: Payer: Self-pay | Admitting: Family Medicine

## 2022-08-11 DIAGNOSIS — E119 Type 2 diabetes mellitus without complications: Secondary | ICD-10-CM

## 2022-09-13 ENCOUNTER — Telehealth: Payer: Self-pay | Admitting: Family Medicine

## 2022-09-13 MED ORDER — LISINOPRIL 10 MG PO TABS: 10.0000 mg | ORAL_TABLET | Freq: Every day | ORAL | 0 refills | Status: DC

## 2022-09-13 MED ORDER — METFORMIN HCL 1000 MG PO TABS: 1000.0000 mg | ORAL_TABLET | Freq: Two times a day (BID) | ORAL | 0 refills | Status: DC

## 2022-09-13 NOTE — Telephone Encounter (Signed)
Pt came in asking for refill on metFORMIN, Lisinopril, and one more that he couldn't remember but said had a code associated with it. Pt asks that he be called when refill is sent in for him so he can go back to pharmacy.

## 2022-09-13 NOTE — Telephone Encounter (Signed)
Pt called and lvm stating medication has been sent in

## 2022-09-13 NOTE — Telephone Encounter (Signed)
Rx sent 

## 2022-09-13 NOTE — Telephone Encounter (Signed)
Pt also asked to send them to [facility] on [address].

## 2022-09-15 ENCOUNTER — Other Ambulatory Visit: Payer: Self-pay | Admitting: Family Medicine

## 2022-09-22 ENCOUNTER — Other Ambulatory Visit: Payer: Self-pay | Admitting: Family Medicine

## 2022-09-24 ENCOUNTER — Ambulatory Visit: Payer: BC Managed Care – PPO | Admitting: Family Medicine

## 2022-09-24 ENCOUNTER — Encounter: Payer: Self-pay | Admitting: Family Medicine

## 2022-09-24 VITALS — BP 120/82 | HR 86 | Temp 97.9°F | Ht 69.0 in | Wt 189.0 lb

## 2022-09-24 DIAGNOSIS — I10 Essential (primary) hypertension: Secondary | ICD-10-CM | POA: Diagnosis not present

## 2022-09-24 DIAGNOSIS — E1165 Type 2 diabetes mellitus with hyperglycemia: Secondary | ICD-10-CM | POA: Diagnosis not present

## 2022-09-24 DIAGNOSIS — E785 Hyperlipidemia, unspecified: Secondary | ICD-10-CM | POA: Diagnosis not present

## 2022-09-24 DIAGNOSIS — Z122 Encounter for screening for malignant neoplasm of respiratory organs: Secondary | ICD-10-CM

## 2022-09-24 DIAGNOSIS — Z1211 Encounter for screening for malignant neoplasm of colon: Secondary | ICD-10-CM

## 2022-09-24 DIAGNOSIS — Z7984 Long term (current) use of oral hypoglycemic drugs: Secondary | ICD-10-CM

## 2022-09-24 MED ORDER — ATORVASTATIN CALCIUM 20 MG PO TABS
20.0000 mg | ORAL_TABLET | Freq: Every day | ORAL | 3 refills | Status: DC
Start: 2022-09-24 — End: 2023-11-05

## 2022-09-24 MED ORDER — LISINOPRIL 10 MG PO TABS
10.0000 mg | ORAL_TABLET | Freq: Every day | ORAL | 2 refills | Status: DC
Start: 2022-09-24 — End: 2023-07-03

## 2022-09-24 MED ORDER — RYBELSUS 14 MG PO TABS: 14.0000 mg | ORAL_TABLET | Freq: Every day | ORAL | 2 refills | Status: DC

## 2022-09-24 MED ORDER — METFORMIN HCL 1000 MG PO TABS
1000.0000 mg | ORAL_TABLET | Freq: Two times a day (BID) | ORAL | 2 refills | Status: DC
Start: 2022-09-24 — End: 2023-07-16

## 2022-09-24 MED ORDER — PIOGLITAZONE HCL 30 MG PO TABS
30.0000 mg | ORAL_TABLET | Freq: Every day | ORAL | 2 refills | Status: DC
Start: 2022-09-24 — End: 2022-11-07

## 2022-09-24 MED ORDER — FARXIGA 10 MG PO TABS
10.0000 mg | ORAL_TABLET | Freq: Every day | ORAL | 2 refills | Status: DC
Start: 2022-09-24 — End: 2023-07-29

## 2022-09-24 NOTE — Addendum Note (Signed)
Addended by: Scharlene Gloss B on: 09/24/2022 04:31 PM   Modules accepted: Orders

## 2022-09-24 NOTE — Progress Notes (Signed)
Subjective:   Chief Complaint  Patient presents with   Follow-up    Insomnia and depression     Dennis Stanton is a 62 y.o. male here for follow-up of diabetes.   [name]'s self monitored glucose range is low-mid 100's.  Patient denies hypoglycemic reactions. He checks his glucose levels 1-2 times per week Patient does not require insulin.   Medications include: Farxiga 10 mg/d, metformin 1000 mg bid, Actos 30 mg/d, Rybelsus 14 mg/d, glyburide 5 mg/d Diet is OK.  Exercise: walking No CP or SOB.   Hypertension Patient presents for hypertension follow up. He does not monitor home blood pressures. He is compliant with medications- lisinopril 10 mg/d. Patient has these side effects of medication: none Diet/exercise as above.  No Cp or SOB.   Hyperlipidemia Patient presents for hyperlipidemia follow up. Currently being treated with Lipitor 20 mg daily and compliance with treatment thus far has been good. He denies myalgias. Diet/exercise as above. The patient is not known to have coexisting coronary artery disease.  Past Medical History:  Diagnosis Date   Allergy    Diabetes mellitus without complication (HCC)    GERD (gastroesophageal reflux disease)    Hyperlipidemia      Related testing: Retinal exam: Done Pneumovax: done  Objective:  BP 120/82 (BP Location: Left Arm, Patient Position: Sitting, Cuff Size: Normal)   Pulse 86   Temp 97.9 F (36.6 C) (Oral)   Ht 5\' 9"  (1.753 m)   Wt 189 lb (85.7 kg)   SpO2 97%   BMI 27.91 kg/m  General:  Well developed, well nourished, in no apparent distress Skin:  Warm, no pallor or diaphoresis Head:  Normocephalic, atraumatic Eyes:  Pupils equal and round, sclera anicteric without injection  Lungs:  CTAB, no access msc use Cardio:  RRR, no bruits, no LE edema Musculoskeletal:  Symmetrical muscle groups noted without atrophy or deformity Neuro:  Sensation intact to pinprick on feet Psych: Age appropriate judgment and  insight  Assessment:   Type 2 diabetes mellitus with hyperglycemia, without long-term current use of insulin (HCC) - Plan: Semaglutide (RYBELSUS) 14 MG TABS, pioglitazone (ACTOS) 30 MG tablet, metFORMIN (GLUCOPHAGE) 1000 MG tablet, FARXIGA 10 MG TABS tablet, Comprehensive metabolic panel, Lipid panel, Hemoglobin A1c, Microalbumin / creatinine urine ratio  Essential hypertension - Plan: lisinopril (ZESTRIL) 10 MG tablet  Hyperlipidemia, unspecified hyperlipidemia type - Plan: atorvastatin (LIPITOR) 20 MG tablet  Screening for lung cancer - Plan: CT CHEST LUNG CANCER SCREENING LOW DOSE WO CONTRAST   Plan:   Chronic, uncontrolled.  For now he will stay on metformin 1000 mg twice daily, Rybelsus 14 mg daily, Farxiga 10 mg daily, Actos 30 mg daily, and glyburide 5 mg daily.  Will increase glyburide and potentially Actos if not controlled.  He is very averse to starting insulin.  Counseled on diet and exercise. He was encouraged to reach out to his eye doctor and schedule his yearly eye exam which is coming up. Chronic, stable.  Continue lisinopril 10 mg daily. Chronic, stable.  Continue Lipitor 20 mg daily. We have referred to the lung cancer screening team but they have not reach out to them.  Will schedule ourselves. Cologuard ordered for colon cancer screening.   Tobacco cessation encouraged. F/u in 3-6 mo. The patient voiced understanding and agreement to the plan.  [name] [place], DO 09/24/22 4:23 PM

## 2022-09-24 NOTE — Patient Instructions (Addendum)
Give [place] 2-3 business days to get the results of your labs back.   Keep the diet clean and stay active.  Please consider adding some weight resistance exercise to your routine. Consider yoga as well.   Please schedule your eye exam.   Let us know if you need anything.

## 2022-09-25 ENCOUNTER — Other Ambulatory Visit: Payer: Self-pay | Admitting: Family Medicine

## 2022-09-25 MED ORDER — GLYBURIDE 5 MG PO TABS: 10.0000 mg | ORAL_TABLET | Freq: Every day | ORAL | 0 refills | Status: DC

## 2022-10-02 ENCOUNTER — Other Ambulatory Visit: Payer: Self-pay | Admitting: Family Medicine

## 2022-10-02 DIAGNOSIS — R195 Other fecal abnormalities: Secondary | ICD-10-CM

## 2022-10-03 ENCOUNTER — Encounter: Payer: Self-pay | Admitting: Family Medicine

## 2022-10-04 ENCOUNTER — Encounter (INDEPENDENT_AMBULATORY_CARE_PROVIDER_SITE_OTHER): Payer: Self-pay

## 2022-10-04 NOTE — Telephone Encounter (Signed)
Pt called back to discuss cologuard and voices understanding and is agreeable to proceed with GI referral.

## 2022-11-05 ENCOUNTER — Telehealth (HOSPITAL_BASED_OUTPATIENT_CLINIC_OR_DEPARTMENT_OTHER): Payer: Self-pay

## 2022-11-07 ENCOUNTER — Telehealth: Payer: Self-pay | Admitting: Family Medicine

## 2022-11-07 ENCOUNTER — Encounter: Payer: Self-pay | Admitting: Family Medicine

## 2022-11-07 ENCOUNTER — Ambulatory Visit: Payer: BC Managed Care – PPO | Admitting: Family Medicine

## 2022-11-07 VITALS — BP 120/78 | HR 63 | Temp 98.6°F | Ht 69.0 in | Wt 188.4 lb

## 2022-11-07 DIAGNOSIS — E1165 Type 2 diabetes mellitus with hyperglycemia: Secondary | ICD-10-CM

## 2022-11-07 DIAGNOSIS — Z7984 Long term (current) use of oral hypoglycemic drugs: Secondary | ICD-10-CM

## 2022-11-07 DIAGNOSIS — G4700 Insomnia, unspecified: Secondary | ICD-10-CM | POA: Diagnosis not present

## 2022-11-07 LAB — GLUCOSE, POCT (MANUAL RESULT ENTRY): POC Glucose: 185 mg/dL — AB (ref 70–99)

## 2022-11-07 MED ORDER — TIRZEPATIDE 10 MG/0.5ML ~~LOC~~ SOPN
10.0000 mg | PEN_INJECTOR | SUBCUTANEOUS | 0 refills | Status: AC
Start: 2022-12-05 — End: 2023-01-02

## 2022-11-07 MED ORDER — TIRZEPATIDE 12.5 MG/0.5ML ~~LOC~~ SOPN
12.5000 mg | PEN_INJECTOR | SUBCUTANEOUS | 0 refills | Status: AC
Start: 2023-01-02 — End: 2023-01-30

## 2022-11-07 MED ORDER — TIRZEPATIDE 15 MG/0.5ML ~~LOC~~ SOPN
15.0000 mg | PEN_INJECTOR | SUBCUTANEOUS | 1 refills | Status: AC
Start: 2023-01-30 — End: ?

## 2022-11-07 MED ORDER — TIRZEPATIDE 7.5 MG/0.5ML ~~LOC~~ SOPN
7.5000 mg | PEN_INJECTOR | SUBCUTANEOUS | 0 refills | Status: AC
Start: 2022-11-07 — End: 2022-12-05

## 2022-11-07 MED ORDER — PIOGLITAZONE HCL 45 MG PO TABS: 45.0000 mg | ORAL_TABLET | Freq: Every day | ORAL | 2 refills | Status: DC

## 2022-11-07 MED ORDER — TRAZODONE HCL 50 MG PO TABS
25.0000 mg | ORAL_TABLET | Freq: Every evening | ORAL | 3 refills | Status: AC | PRN
Start: 2022-11-07 — End: ?

## 2022-11-07 NOTE — Patient Instructions (Addendum)
Our sugars are not exactly where we need it to be.   Let me know if there are cost issues.   Please get your eye exam.  Please schedule your colonoscopy.   Sleep Hygiene Tips: Do not watch TV or look at screens within 1 hour of going to bed. If you do, make sure there is a blue light filter (nighttime mode) involved. Try to go to bed around the same time every night. Wake up at the same time within 1 hour of regular time. Ex: If you wake up at 7 AM for work, do not sleep past 8 AM on days that you don't work. Do not drink alcohol before bedtime. Do not consume caffeine-containing beverages after noon or within 9 hours of intended bedtime. Get regular exercise/physical activity in your life, but not within 2 hours of planned bedtime. Do not take naps.  Do not eat within 2 hours of planned bedtime. Melatonin, 3-5 mg 30-60 minutes before planned bedtime may be helpful.  The bed should be for sleep or sex only. If after 20-30 minutes you are unable to fall asleep, get up and do something relaxing. Do this until you feel ready to go to sleep again.   Let us know if you need anything.

## 2022-11-07 NOTE — Telephone Encounter (Signed)
PA team will take care of this

## 2022-11-07 NOTE — Telephone Encounter (Signed)
Fatima from [facility] called to inform Dr. Carmelia Roller that they will be faxing a prior auth for the medication tirzepatide (MOUNJARO) 7.5 MG/0.5ML Pen. Please advise.

## 2022-11-07 NOTE — Progress Notes (Signed)
Chief Complaint  Patient presents with   Follow-up    Subjective: Patient is a 62 y.o. male here for f/u.  A1c increased to 8.8. Currently taking Rybelsus 14 mg/d, Actos 30 mg/d, metformin 1000 mg bid, glyburide 10 mg bid, Farxiga 10 mg/d. He strongly prefers to avoid insulin "at all costs". Diet is OK. He is walking. No CP or SOB. No sugar lows. Sugars running at mid-high 100's. Nonfasting is mid 100s.    Insomnia Having difficulty staying asleep over past couple yrs since retiring. Melatonin helps him fall asleep. Some racing thoughts. No anxiety. Has never tried rx meds.   Past Medical History:  Diagnosis Date   Allergy    Diabetes mellitus without complication (HCC)    GERD (gastroesophageal reflux disease)    Hyperlipidemia     Objective: BP 120/78 (BP Location: Left Arm, Patient Position: Sitting, Cuff Size: Normal)   Pulse 63   Temp 98.6 F (37 C) (Oral)   Ht 5\' 9"  (1.753 m)   Wt 188 lb 6 oz (85.4 kg)   SpO2 97%   BMI 27.82 kg/m  General: Awake, appears stated age Heart: RRR, no LE edema Lungs: CTAB, no rales, wheezes or rhonchi. No accessory muscle use Psych: Age appropriate judgment and insight, normal affect and mood  Assessment and Plan: Type 2 diabetes mellitus with hyperglycemia, without long-term current use of insulin (HCC) - Plan: POCT Glucose (CBG), pioglitazone (ACTOS) 45 MG tablet, tirzepatide (MOUNJARO) 7.5 MG/0.5ML Pen, tirzepatide (MOUNJARO) 10 MG/0.5ML Pen, tirzepatide (MOUNJARO) 12.5 MG/0.5ML Pen, tirzepatide (MOUNJARO) 15 MG/0.5ML Pen  Insomnia, unspecified type - Plan: traZODone (DESYREL) 50 MG tablet  Chronic, uncontrolled. Increase Actos to 45 mg/d. Start Mounjaro at 7.5 mg/week and steadily up-titrate. Stop Rybelsus 14 mg/d, cont metformin 1000 mg bid, glyburide 10 mg bid, Farxiga 10 mg/d. Counseled on diet/exercise.  Chronic, unstable. Start trazodone 25-50 mg qhs prn. Sleep hygiene info provided.  F/u in 1 mo.  The patient voiced understanding  and agreement to the plan.  [name] [place], DO 11/07/22  8:52 AM

## 2022-11-16 ENCOUNTER — Telehealth: Payer: Self-pay | Admitting: Family Medicine

## 2022-11-16 NOTE — Telephone Encounter (Signed)
Pt said that the pharmacy won't fill his rybelsus.Sounded like it may need a prior authorization but he was unclear. Please follow up with the patient to advise

## 2022-11-20 ENCOUNTER — Other Ambulatory Visit (HOSPITAL_COMMUNITY): Payer: Self-pay

## 2022-11-20 NOTE — Telephone Encounter (Signed)
Pt called back to get an update on the pa.

## 2022-11-20 NOTE — Telephone Encounter (Signed)
Could you please look into this PA

## 2022-11-20 NOTE — Telephone Encounter (Signed)
I have not seen a PA for this patient/medication in his chart.

## 2022-11-23 ENCOUNTER — Telehealth: Payer: Self-pay

## 2022-11-23 NOTE — Telephone Encounter (Signed)
Pharmacy Patient Advocate Encounter   Received notification from Pt Calls Messages that prior authorization for Rybelsus 14MG  tablets is required/requested.   Insurance verification completed.   The patient is insured through [facility] .   Per test claim: PA required; PA submitted to [facility] via CoverMyMeds Key/confirmation #/EOC [id number] Status is pending

## 2022-11-23 NOTE — Telephone Encounter (Signed)
PA request has been Submitted. New Encounter created for follow up. For additional info see Pharmacy Prior Auth telephone encounter from 11-23-2022.

## 2022-11-27 ENCOUNTER — Encounter: Payer: Self-pay | Admitting: Family Medicine

## 2022-11-27 ENCOUNTER — Ambulatory Visit: Payer: BC Managed Care – PPO | Admitting: Family Medicine

## 2022-11-27 VITALS — BP 120/72 | HR 81 | Temp 99.1°F | Ht 69.0 in | Wt 191.0 lb

## 2022-11-27 DIAGNOSIS — L089 Local infection of the skin and subcutaneous tissue, unspecified: Secondary | ICD-10-CM

## 2022-11-27 DIAGNOSIS — W548XXA Other contact with dog, initial encounter: Secondary | ICD-10-CM | POA: Diagnosis not present

## 2022-11-27 MED ORDER — AMOXICILLIN-POT CLAVULANATE 875-125 MG PO TABS
1.0000 | ORAL_TABLET | Freq: Two times a day (BID) | ORAL | 0 refills | Status: AC
Start: 2022-11-27 — End: 2022-12-04

## 2022-11-27 NOTE — Telephone Encounter (Signed)
Actually have you seen a PA for this patients Mounjaro?

## 2022-11-27 NOTE — Patient Instructions (Signed)
If the skin lesion gets worse, use the antibiotic.  When you do wash it, use only soap and water. Do not vigorously scrub. Apply triple antibiotic ointment (like Neosporin) twice daily. Keep the area clean and dry.   Things to look out for: increasing pain not relieved by ibuprofen/acetaminophen, fevers, spreading redness, drainage of pus, or foul odor.  Let us know if you need anything.

## 2022-11-27 NOTE — Progress Notes (Signed)
Chief Complaint  Patient presents with   scratched by a dog    Dennis Stanton is a 62 y.o. male here for a skin complaint.  Duration: 2 days Location: RLE Scratched by a dog Pruritic? No Painful? Yes Drainage? No Other associated symptoms: redness, warmth; no fevers Therapies tried thus far: TAO  Past Medical History:  Diagnosis Date   Allergy    Diabetes mellitus without complication (HCC)    GERD (gastroesophageal reflux disease)    Hyperlipidemia     BP 120/72 (BP Location: Right Arm, Patient Position: Sitting, Cuff Size: Normal)   Pulse 81   Temp 99.1 F (37.3 C) (Oral)   Ht 5\' 9"  (1.753 m)   Wt 191 lb (86.6 kg)   SpO2 98%   BMI 28.21 kg/m  Gen: awake, alert, appearing stated age Lungs: No accessory muscle use Skin: See below. No drainage, fluctuance Psych: Age appropriate judgment and insight   RLE medial  Dog scratch - Plan: amoxicillin-clavulanate (AUGMENTIN) 875-125 MG tablet  Skin infection - Plan: amoxicillin-clavulanate (AUGMENTIN) 875-125 MG tablet  Seems stable now. TAO for the next week. Augmentin for 7 d if things worsen. Warning signs and symptoms verbalized and written down in AVS.  F/u prn. The patient voiced understanding and agreement to the plan.  [name] [place], DO 11/27/22 11:32 AM

## 2022-11-27 NOTE — Telephone Encounter (Signed)
Pharmacy Patient Advocate Encounter  Received notification from [facility] that Prior Authorization for Rybelsus 14MG  tablets has been APPROVED from 11-23-2022 to 11-22-2025   PA #/Case ID/Reference #: [id number]

## 2022-11-27 NOTE — Telephone Encounter (Signed)
Any answers to when this may be completed.

## 2022-11-27 NOTE — Telephone Encounter (Signed)
Approved. I did send them a message regarding Mounjaro I have not seen a PA on [facility]

## 2022-11-27 NOTE — Telephone Encounter (Signed)
This patient is needing a PA on [facility]

## 2022-11-28 IMAGING — US US ABDOMEN COMPLETE
1 series · 13 of 25 positions shown · non-contrast
Comparison: Overlapping portion of CT chest 07/14/2020

CLINICAL DATA: Thrombocytopenia

EXAM:
ABDOMEN ULTRASOUND COMPLETE

[Series 1: us abdomen complete · 13 of 72 slices shown]
[im 1/72]
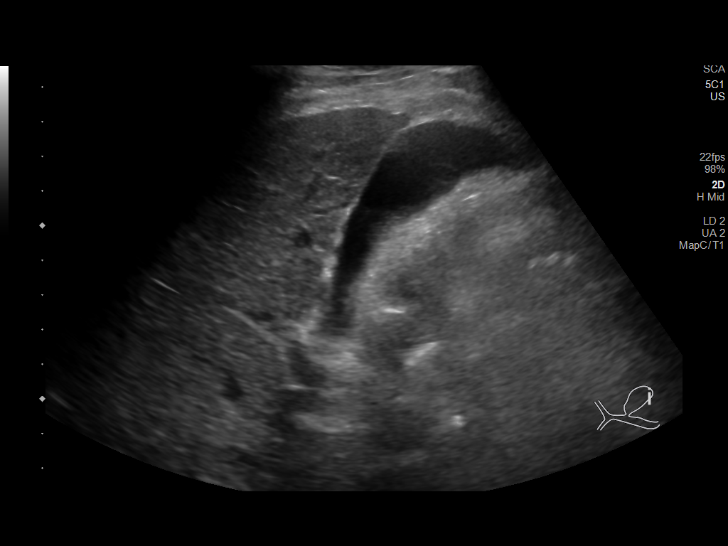
[im 6/72]
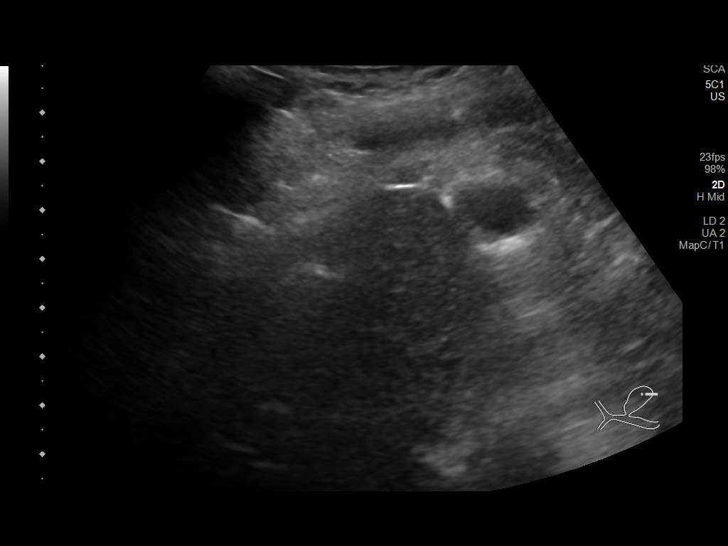
[im 12/72]
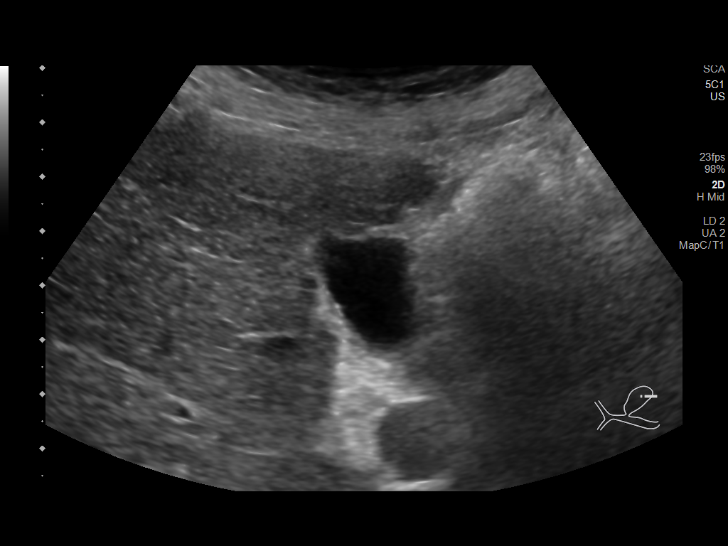
[im 18/72]
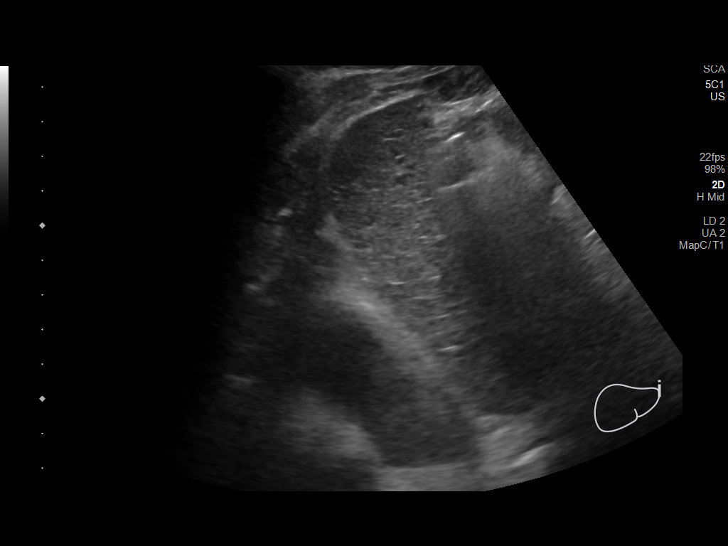
[im 24/72]
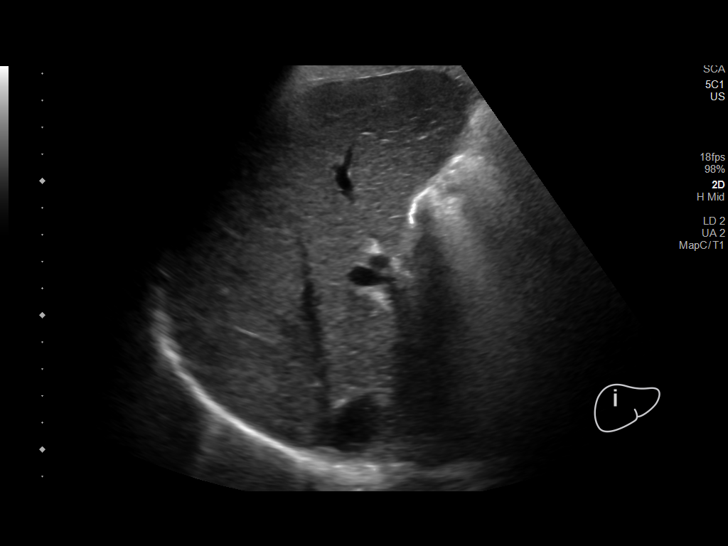
[im 30/72]
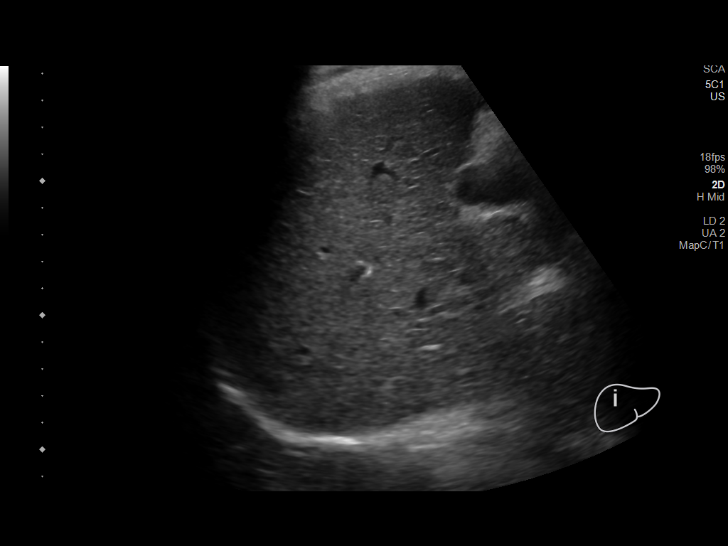
[im 36/72]
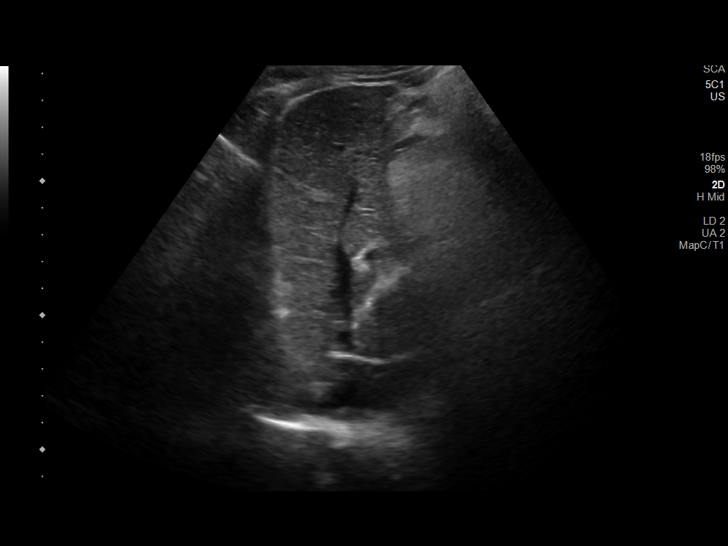
[im 42/72]
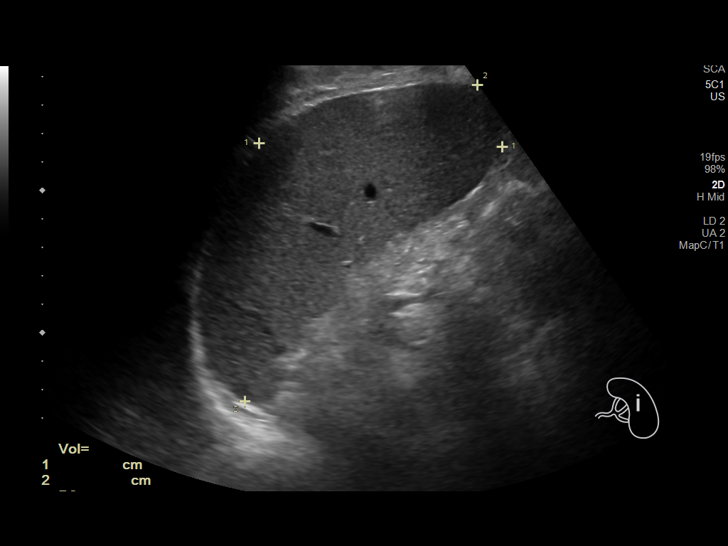
[im 48/72]
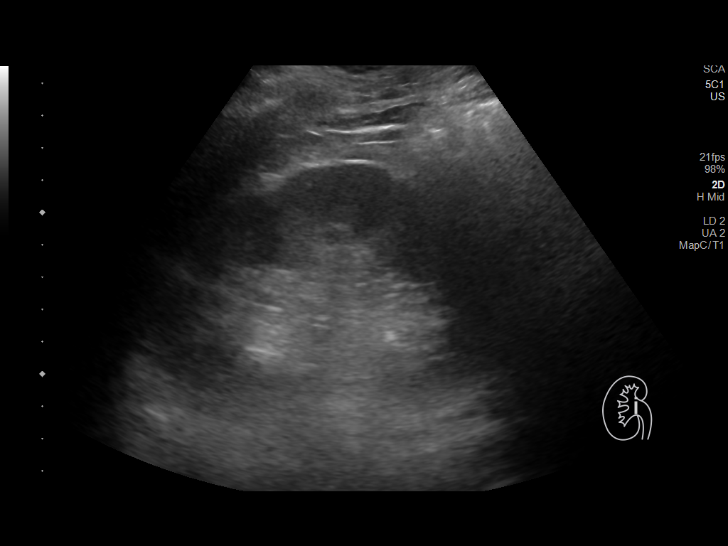
[im 54/72]
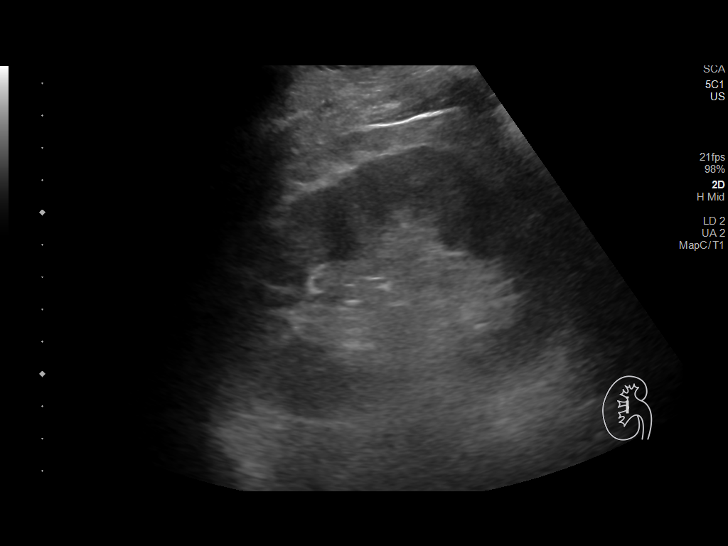
[im 60/72]
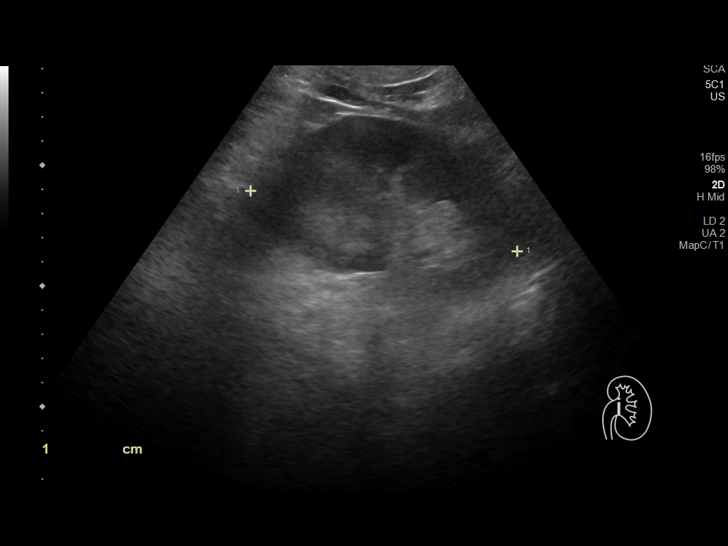
[im 66/72]
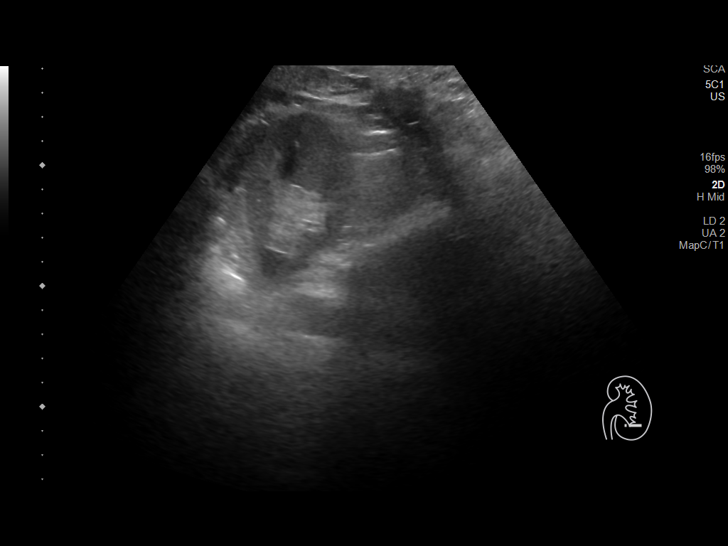
[im 72/72]
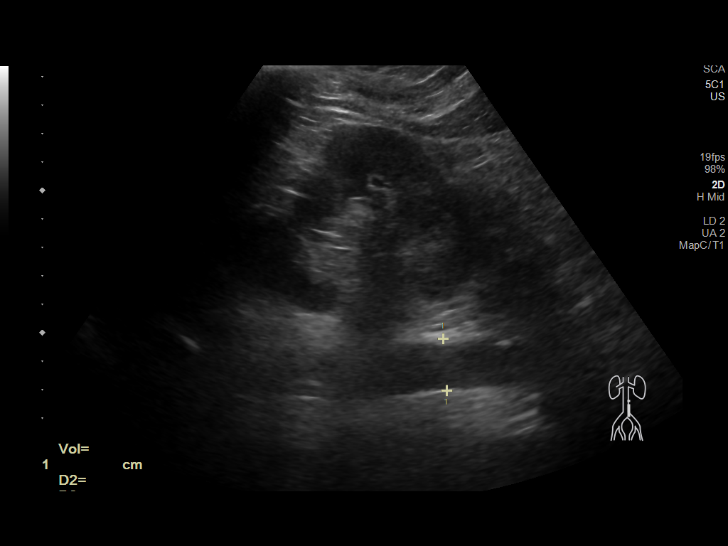

[13 of 25 positions shown; findings below may reference images not displayed]

FINDINGS: Gallbladder: No gallstones or wall thickening visualized. No
sonographic Murphy sign noted by sonographer. 0.2 cm polyp in the
gallbladder does not require further workup.

Common bile duct: Diameter: 2 mm

Liver: No focal lesion identified. Within normal limits in
parenchymal echogenicity. No hepatomegaly. Portal vein is patent on
color Doppler imaging with normal direction of blood flow towards
the liver.

IVC: Unremarkable where included.

Pancreas: Poorly seen due to overlying bowel gas. The visualized
portion of the pancreatic body appears normal.

Spleen: Upper normal splenic size and volume. No focal splenic
lesion.

Right Kidney: Length: 12.0 cm. Echogenicity within normal limits. No
mass or hydronephrosis visualized.

Left Kidney: Length: 11.3 cm. Echogenicity within normal limits. No
mass or hydronephrosis visualized.

Abdominal aorta: No aneurysm visualized. Proximal abdominal aorta
not well seen due to overlying bowel gas.

Other findings: None.
IMPRESSION: 1. No overt hepatomegaly or splenomegaly. No focal hepatic or
splenic lesion.
2. 0.2 cm polyp in the gallbladder. Polyps of this size are
considered benign and do not warrant further workup.

## 2022-11-28 NOTE — Telephone Encounter (Signed)
Pharmacy Patient Advocate Encounter   Received notification from Pt Calls Messages that prior authorization for Mounjaro 7.5MG /0.5ML pen-injectors is required/requested.   Insurance verification completed.   The patient is insured through [facility] .   Per test claim: PA required; PA submitted to [facility] via CoverMyMeds Key/confirmation #/EOC [id number] Status is pending

## 2022-12-04 NOTE — Telephone Encounter (Signed)
Pharmacy Patient Advocate Encounter  Received notification from [facility] that Prior Authorization for [facility] has been APPROVED from 11/29/2022 to 11/27/2025   PA #/Case ID/Reference #: [id number]

## 2022-12-05 NOTE — Telephone Encounter (Signed)
Patient informed approved.

## 2023-01-06 ENCOUNTER — Other Ambulatory Visit: Payer: Self-pay | Admitting: Family Medicine

## 2023-03-19 ENCOUNTER — Ambulatory Visit (AMBULATORY_SURGERY_CENTER): Payer: 59

## 2023-03-19 VITALS — Ht 69.0 in | Wt 190.0 lb

## 2023-03-19 DIAGNOSIS — Z860100 Personal history of colon polyps, unspecified: Secondary | ICD-10-CM

## 2023-03-19 DIAGNOSIS — R195 Other fecal abnormalities: Secondary | ICD-10-CM

## 2023-03-19 MED ORDER — POLYETHYLENE GLYCOL 3350 17 GM/SCOOP PO POWD: 0.5000 | Freq: Every day | ORAL | 0 refills | Status: DC

## 2023-03-19 MED ORDER — SUFLAVE 178.7 G PO SOLR: 1.0000 | ORAL | 0 refills | Status: DC

## 2023-03-19 NOTE — Progress Notes (Signed)

## 2023-03-27 ENCOUNTER — Encounter: Payer: Self-pay | Admitting: Gastroenterology

## 2023-03-27 ENCOUNTER — Telehealth: Payer: Self-pay

## 2023-03-27 NOTE — Telephone Encounter (Signed)
 Attempted to reach patient, unable to speak with patient and not able to leave a voicemail as "mailbox if full"; will attempt to reach patient at a later date/time as follow up;

## 2023-04-01 ENCOUNTER — Other Ambulatory Visit: Payer: Self-pay | Admitting: Family Medicine

## 2023-04-02 NOTE — Telephone Encounter (Signed)
I attempted to reach the patient again today.  No answer and VM is full.  Notes in chart indicated he reached out to [facility] and rx was changed to Miralax.  Will leave procedure as scheduled.

## 2023-04-04 ENCOUNTER — Encounter: Payer: Self-pay | Admitting: Gastroenterology

## 2023-04-04 ENCOUNTER — Ambulatory Visit: Payer: 59 | Admitting: Gastroenterology

## 2023-04-04 VITALS — BP 98/71 | HR 75 | Temp 97.4°F | Resp 17 | Ht 69.0 in | Wt 190.0 lb

## 2023-04-04 DIAGNOSIS — R195 Other fecal abnormalities: Secondary | ICD-10-CM

## 2023-04-04 DIAGNOSIS — D120 Benign neoplasm of cecum: Secondary | ICD-10-CM

## 2023-04-04 DIAGNOSIS — K635 Polyp of colon: Secondary | ICD-10-CM

## 2023-04-04 DIAGNOSIS — Z860101 Personal history of adenomatous and serrated colon polyps: Secondary | ICD-10-CM

## 2023-04-04 DIAGNOSIS — Z860100 Personal history of colon polyps, unspecified: Secondary | ICD-10-CM

## 2023-04-04 DIAGNOSIS — Z1211 Encounter for screening for malignant neoplasm of colon: Secondary | ICD-10-CM | POA: Diagnosis present

## 2023-04-04 DIAGNOSIS — D122 Benign neoplasm of ascending colon: Secondary | ICD-10-CM

## 2023-04-04 DIAGNOSIS — D124 Benign neoplasm of descending colon: Secondary | ICD-10-CM

## 2023-04-04 MED ORDER — SODIUM CHLORIDE 0.9 % IV SOLN: 500.0000 mL | Freq: Once | INTRAVENOUS | Status: DC

## 2023-04-04 NOTE — Progress Notes (Signed)
Pt's states no medical or surgical changes since previsit or office visit.

## 2023-04-04 NOTE — Progress Notes (Signed)
 [facility] History and Physical   Primary Care Physician:  Sharlene Dory, DO   Reason for Procedure:   History of colon polyps  Plan:    Colonoscopy     HPI: Dennis Stanton is a 63 y.o. male undergoing surveillance colonoscopy.  He has no family history of colon cancer and no chronic GI symptoms.  He underwent a colonoscopy in 2019 in which a single tubular adenoma and numerous hyperplastic polyps were removed.   Past Medical History:  Diagnosis Date   Allergy    Diabetes mellitus without complication (HCC)    GERD (gastroesophageal reflux disease)    Hyperlipidemia     Past Surgical History:  Procedure Laterality Date   broken elbow Right     Prior to Admission medications   Medication Sig Start Date End Date Taking? Authorizing Provider  aspirin 81 MG chewable tablet Chew 1 mg by mouth daily.   Yes [provider]  atorvastatin (LIPITOR) 20 MG tablet Take 1 tablet (20 mg total) by mouth daily. 09/24/22  Yes Wendling, [name], DO  FARXIGA 10 MG TABS tablet Take 1 tablet (10 mg total) by mouth daily. 09/24/22  Yes Wendling, [name], DO  glucose blood (ONETOUCH VERIO) test strip USE 1 STRIP TO CHECK GLUCOSE THREE TIMES DAILY 06/23/21  Yes Sharlene Dory, DO  glyBURIDE (DIABETA) 5 MG tablet TAKE 2 TABLETS BY MOUTH ONCE DAILY WITH BREAKFAST 04/01/23  Yes Sharlene Dory, DO  lisinopril (ZESTRIL) 10 MG tablet Take 1 tablet (10 mg total) by mouth daily. 09/24/22  Yes Sharlene Dory, DO  metFORMIN (GLUCOPHAGE) 1000 MG tablet Take 1 tablet (1,000 mg total) by mouth 2 (two) times daily with a meal. 09/24/22  Yes Wendling, [name], DO  pioglitazone (ACTOS) 45 MG tablet Take 1 tablet (45 mg total) by mouth daily. 11/07/22  Yes Wendling, [name], DO  RYBELSUS 14 MG TABS Take 1 tablet by mouth daily. 02/03/23  Yes [provider]  traZODone (DESYREL) 50 MG tablet Take 0.5-1 tablets (25-50 mg total) by mouth  at bedtime as needed for sleep. 11/07/22  Yes Sharlene Dory, DO  Cholecalciferol (VITAMIN D3) 50 MCG (2000 UT) capsule Take 2,000 Units by mouth daily. Patient not taking: Reported on 04/04/2023    [provider]  fluticasone (FLONASE) 50 MCG/ACT nasal spray Place 2 sprays into both nostrils daily. Patient not taking: Reported on 04/04/2023 10/18/21   Sharlene Dory, DO  polyethylene glycol powder (GLYCOLAX/MIRALAX) 17 GM/SCOOP powder Take 127.5 g by mouth daily. Follow directions in your bowel prep paperwork Patient not taking: Reported on 04/04/2023 03/19/23   Jenel Lucks, MD  tirzepatide [facility]) 15 MG/0.5ML Pen Inject 15 mg into the skin once a week. Patient not taking: Reported on 04/04/2023 01/30/23   Sharlene Dory, DO  vitamin B-12 (CYANOCOBALAMIN) 1000 MCG tablet Take 1,000 mcg by mouth daily. Patient not taking: Reported on 04/04/2023    [provider]    Current Outpatient Medications  Medication Sig Dispense Refill   aspirin 81 MG chewable tablet Chew 1 mg by mouth daily.     atorvastatin (LIPITOR) 20 MG tablet Take 1 tablet (20 mg total) by mouth daily. 90 tablet 3   FARXIGA 10 MG TABS tablet Take 1 tablet (10 mg total) by mouth daily. 90 tablet 2   glucose blood (ONETOUCH VERIO) test strip USE 1 STRIP TO CHECK GLUCOSE THREE TIMES DAILY 100 each 0   glyBURIDE (DIABETA) 5 MG  tablet TAKE 2 TABLETS BY MOUTH ONCE DAILY WITH BREAKFAST 180 tablet 0   lisinopril (ZESTRIL) 10 MG tablet Take 1 tablet (10 mg total) by mouth daily. 90 tablet 2   metFORMIN (GLUCOPHAGE) 1000 MG tablet Take 1 tablet (1,000 mg total) by mouth 2 (two) times daily with a meal. 180 tablet 2   pioglitazone (ACTOS) 45 MG tablet Take 1 tablet (45 mg total) by mouth daily. 90 tablet 2   RYBELSUS 14 MG TABS Take 1 tablet by mouth daily.     traZODone (DESYREL) 50 MG tablet Take 0.5-1 tablets (25-50 mg total) by mouth at bedtime as needed for sleep. 30 tablet 3    Cholecalciferol (VITAMIN D3) 50 MCG (2000 UT) capsule Take 2,000 Units by mouth daily. (Patient not taking: Reported on 04/04/2023)     fluticasone (FLONASE) 50 MCG/ACT nasal spray Place 2 sprays into both nostrils daily. (Patient not taking: Reported on 04/04/2023) 16 g 2   polyethylene glycol powder (GLYCOLAX/MIRALAX) 17 GM/SCOOP powder Take 127.5 g by mouth daily. Follow directions in your bowel prep paperwork (Patient not taking: Reported on 04/04/2023) 127.5 g 0   tirzepatide (MOUNJARO) 15 MG/0.5ML Pen Inject 15 mg into the skin once a week. (Patient not taking: Reported on 04/04/2023) 6 mL 1   vitamin B-12 (CYANOCOBALAMIN) 1000 MCG tablet Take 1,000 mcg by mouth daily. (Patient not taking: Reported on 04/04/2023)     Current Facility-Administered Medications  Medication Dose Route Frequency Provider Last Rate Last Admin   0.9 %  sodium chloride infusion  500 mL Intravenous Once Jenel Lucks, MD        Allergies as of 04/04/2023   (No Known Allergies)    Family History  Problem Relation Age of Onset   Bladder Cancer Father    Diabetes Mother    Heart disease Mother    Hypertension Mother    Diabetes Sister    Colon cancer Neg Hx    Esophageal cancer Neg Hx    Rectal cancer Neg Hx    Stomach cancer Neg Hx     Social History   Socioeconomic History   Marital status: Married    Spouse name: Not on file   Number of children: Not on file   Years of education: Not on file   Highest education level: Not on file  Occupational History   Not on file  Tobacco Use   Smoking status: Every Day    Current packs/day: 0.50    Average packs/day: 0.5 packs/day for 40.0 years (20.0 ttl pk-yrs)    Types: Cigarettes   Smokeless tobacco: Never  Vaping Use   Vaping status: Never Used  Substance and Sexual Activity   Alcohol use: Yes    Comment: occasional   Drug use: No   Sexual activity: Yes    Partners: Female  Other Topics Concern   Not on file  Social History Narrative   Not  on file   Social Drivers of Health   Financial Resource Strain: Not on file  Food Insecurity: Not on file  Transportation Needs: Not on file  Physical Activity: Not on file  Stress: Not on file  Social Connections: Not on file  Intimate Partner Violence: Not on file    Review of Systems:  All other review of systems negative except as mentioned in the HPI.  Physical Exam: Vital signs BP 120/72   Pulse 81   Temp (!) 97.4 F (36.3 C) (Skin)   Ht 5\' 9"  (1.753  m)   Wt 190 lb (86.2 kg)   SpO2 98%   BMI 28.06 kg/m   General:   Alert,  Well-developed, well-nourished, pleasant and cooperative in NAD Airway:  Mallampati 2 Lungs:  Clear throughout to auscultation.   Heart:  Regular rate and rhythm; no murmurs, clicks, rubs,  or gallops. Abdomen:  Soft, nontender and nondistended. Normal bowel sounds.   Neuro/Psych:  Normal mood and affect. A and O x 3   Leelynn Whetsel E. Tomasa Rand, MD [facility]

## 2023-04-04 NOTE — Op Note (Signed)
[facility] Patient Name: Dennis Stanton Procedure Date: 04/04/2023 1:48 PM MRN: [id number] Endoscopist: Lorin Picket E. Tomasa Rand , MD, [id number] Age: 63 Referring MD:  Date of Birth: 03-08-1960 Gender: Male Account #: [id number] Procedure:                Colonoscopy Indications:              Surveillance: Personal history of adenomatous                            polyps on last colonoscopy >5 years ago. Last                            colonoscopy: April 2018 (1 adenoma, 12 hyperplastic                            polyps removed) Medicines:                Monitored Anesthesia Care Procedure:                Pre-Anesthesia Assessment:                           - Prior to the procedure, a History and Physical                            was performed, and patient medications and                            allergies were reviewed. The patient's tolerance of                            previous anesthesia was also reviewed. The risks                            and benefits of the procedure and the sedation                            options and risks were discussed with the patient.                            All questions were answered, and informed consent                            was obtained. Prior Anticoagulants: The patient has                            taken no anticoagulant or antiplatelet agents. ASA                            Grade Assessment: II - A patient with mild systemic                            disease. After reviewing the risks and benefits,  the patient was deemed in satisfactory condition to                            undergo the procedure.                           After obtaining informed consent, the colonoscope                            was passed under direct vision. Throughout the                            procedure, the patient's blood pressure, pulse, and                            oxygen saturations were monitored  continuously. The                            Olympus Scope [id number] was introduced through the                            anus and advanced to the the cecum, identified by                            appendiceal orifice and ileocecal valve. The                            colonoscopy was somewhat difficult due to poor                            endoscopic visualization secondary to copious                            liquid stool with solid debris, deep colonic folds,                            and excessive spasm/poor insufflation. The patient                            tolerated the procedure well. The quality of the                            bowel preparation was fair. The ileocecal valve,                            appendiceal orifice, and rectum were photographed.                            The bowel preparation used was SUPREP via split                            dose instruction. Scope In: 2:04:12 PM Scope Out: 2:32:54 PM Scope Withdrawal Time: 0 hours 22 minutes 40 seconds  Total Procedure Duration: 0 hours 28 minutes  42 seconds  Findings:                 The perianal and digital rectal examinations were                            normal. Pertinent negatives include normal                            sphincter tone and no palpable rectal lesions.                           A 3 mm polyp was found in the cecum. The polyp was                            sessile. The polyp was removed with a cold snare.                            Resection and retrieval were complete. Estimated                            blood loss was minimal.                           A 4 mm polyp was found in the ascending colon. The                            polyp was sessile. The polyp was removed with a                            cold snare. Resection and retrieval were complete.                            Estimated blood loss was minimal.                           A 5 mm polyp was found in the descending colon.  The                            polyp was sessile. The polyp was removed with a                            cold snare. Resection and retrieval were complete.                            Estimated blood loss was minimal.                           Many hyperplastic polyps were found in the rectum                            and sigmoid colon. The polyps were small in size.  The exam was otherwise normal throughout the                            examined colon.                           The retroflexed view of the distal rectum and anal                            verge was normal and showed no anal or rectal                            abnormalities. Complications:            No immediate complications. Estimated Blood Loss:     Estimated blood loss was minimal. Impression:               - Preparation of the colon was fair.                           - One 3 mm polyp in the cecum, removed with a cold                            snare. Resected and retrieved.                           - One 4 mm polyp in the ascending colon, removed                            with a cold snare. Resected and retrieved.                           - One 5 mm polyp in the descending colon, removed                            with a cold snare. Resected and retrieved.                           - Many small polyps in the rectum and in the                            sigmoid colon.                           - The distal rectum and anal verge are normal on                            retroflexion view. Recommendation:           - Patient has a contact number available for                            emergencies. The signs and symptoms of potential  delayed complications were discussed with the                            patient. Return to normal activities tomorrow.                            Written discharge instructions were provided to the                             patient.                           - Resume previous diet.                           - Continue present medications.                           - Await pathology results.                           - Repeat colonoscopy (date not yet determined) for                            surveillance based on pathology results. Gitty Osterlund E. Tomasa Rand, MD 04/04/2023 2:42:09 PM This report has been signed electronically.

## 2023-04-04 NOTE — Progress Notes (Signed)
Called to room to assist during endoscopic procedure.  Patient ID and intended procedure confirmed with present staff. Received instructions for my participation in the procedure from the performing physician.

## 2023-04-04 NOTE — Patient Instructions (Signed)

## 2023-04-04 NOTE — Progress Notes (Signed)
Sedate, gd SR, tolerated procedure well, VSS, report to RN

## 2023-04-05 ENCOUNTER — Telehealth: Payer: Self-pay | Admitting: *Deleted

## 2023-04-05 NOTE — Telephone Encounter (Signed)
Post procedure follow up call placed, no answer and no VM.

## 2023-04-09 LAB — SURGICAL PATHOLOGY

## 2023-04-11 ENCOUNTER — Encounter: Payer: Self-pay | Admitting: Gastroenterology

## 2023-04-11 NOTE — Progress Notes (Signed)
 Dennis Stanton,  At least one of the polyps resected was a precancerous polyp.  Given your history of precancerous polyps in the past as well as your numerous hyperplastic polyps and the difficulty with visualization during this most recent colonoscopy, I would recommend closer surveillance than would be recommended based on your 1 precancerous polyp. Please repeat a colonoscopy in 5 years.  A 2-day bowel prep may be more effective and allow for better visualization.

## 2023-07-03 ENCOUNTER — Other Ambulatory Visit: Payer: Self-pay | Admitting: Family Medicine

## 2023-07-03 DIAGNOSIS — I10 Essential (primary) hypertension: Secondary | ICD-10-CM

## 2023-07-16 ENCOUNTER — Other Ambulatory Visit: Payer: Self-pay | Admitting: Family Medicine

## 2023-07-16 DIAGNOSIS — E1165 Type 2 diabetes mellitus with hyperglycemia: Secondary | ICD-10-CM

## 2023-07-29 ENCOUNTER — Other Ambulatory Visit: Payer: Self-pay | Admitting: Family Medicine

## 2023-07-29 DIAGNOSIS — I10 Essential (primary) hypertension: Secondary | ICD-10-CM

## 2023-07-29 DIAGNOSIS — E1165 Type 2 diabetes mellitus with hyperglycemia: Secondary | ICD-10-CM

## 2023-08-01 ENCOUNTER — Other Ambulatory Visit: Payer: Self-pay | Admitting: Family Medicine

## 2023-08-01 DIAGNOSIS — G4700 Insomnia, unspecified: Secondary | ICD-10-CM

## 2023-08-05 ENCOUNTER — Other Ambulatory Visit: Payer: Self-pay

## 2023-08-05 ENCOUNTER — Encounter: Payer: Self-pay | Admitting: Family Medicine

## 2023-08-05 ENCOUNTER — Ambulatory Visit (INDEPENDENT_AMBULATORY_CARE_PROVIDER_SITE_OTHER): Admitting: Family Medicine

## 2023-08-05 VITALS — BP 110/76 | HR 79 | Temp 98.0°F | Resp 16 | Ht 69.0 in | Wt 180.8 lb

## 2023-08-05 DIAGNOSIS — G4700 Insomnia, unspecified: Secondary | ICD-10-CM | POA: Diagnosis not present

## 2023-08-05 DIAGNOSIS — I10 Essential (primary) hypertension: Secondary | ICD-10-CM

## 2023-08-05 DIAGNOSIS — E785 Hyperlipidemia, unspecified: Secondary | ICD-10-CM

## 2023-08-05 DIAGNOSIS — E1165 Type 2 diabetes mellitus with hyperglycemia: Secondary | ICD-10-CM | POA: Diagnosis not present

## 2023-08-05 DIAGNOSIS — Z7984 Long term (current) use of oral hypoglycemic drugs: Secondary | ICD-10-CM

## 2023-08-05 MED ORDER — LISINOPRIL 10 MG PO TABS: 10.0000 mg | ORAL_TABLET | Freq: Every day | ORAL | 1 refills | Status: AC

## 2023-08-05 MED ORDER — AMITRIPTYLINE HCL 10 MG PO TABS: 10.0000 mg | ORAL_TABLET | Freq: Every day | ORAL | 1 refills | Status: DC

## 2023-08-05 MED ORDER — LISINOPRIL 10 MG PO TABS: 10.0000 mg | ORAL_TABLET | Freq: Every day | ORAL | 1 refills | Status: DC

## 2023-08-05 MED ORDER — GLYBURIDE 5 MG PO TABS: 10.0000 mg | ORAL_TABLET | Freq: Every day | ORAL | 1 refills | Status: DC

## 2023-08-05 MED ORDER — GLYBURIDE 5 MG PO TABS: 10.0000 mg | ORAL_TABLET | Freq: Every day | ORAL | 1 refills | Status: AC

## 2023-08-05 NOTE — Patient Instructions (Addendum)
Give us 2-3 business days to get the results of your labs back.  ° °Keep the diet clean and stay active. ° °Please consider counseling. Contact [phone] to schedule an appointment or inquire about cost/insurance coverage. ° °[facility] located at [address], [place], [place].  Phone number = [phone].  Dr. Onoriode Edeh - Adult Psychiatry.  °  °[facility] located at [address], [place], [place]. Phone number = [phone]. °  °[facility] located at [address], [place], [place].  Phone number = [phone]. °  °[facility] located at [address], [place], [place].  Phone number = [phone]. ° °Let us know if you need anything. °

## 2023-08-05 NOTE — Progress Notes (Signed)
 Subjective:   Chief Complaint  Patient presents with   Medication Refill    Medication Refill    Dennis Stanton is a 63 y.o. male here for follow-up of diabetes.   Dennis Stanton does not routinely check his sugars.  Patient denies hypoglycemic reactions. Patient does not require insulin.   Medications include: Rybelsus  14 mg/d, metformin  1000 mg bid, Actos  45 mg/d, Farxiga  10 mg/d, glyburide  10 mg bid Diet is OK.  Exercise: walking, active in yard  Hypertension Patient presents for hypertension follow up. He does monitor home blood pressures. He is compliant with medication- lisinopril  10 mg/d Patient has these side effects of medication: none Diet/exercise as above.  No CP or SOB.   Insomnia Going thru a divorce. Sleep has worsened since then. Failed trazodone . Not following w a therapist.  No homicidal or suicidal ideation.  No self-medication.  Past Medical History:  Diagnosis Date   Allergy    Diabetes mellitus without complication (HCC)    GERD (gastroesophageal reflux disease)    Hyperlipidemia      Related testing: Retinal exam: Done Pneumovax: done  Objective:  BP 110/76 (BP Location: Left Arm, Patient Position: Sitting)   Pulse 79   Temp 98 F (36.7 C) (Oral)   Resp 16   Ht 5' 9 (1.753 m)   Wt 180 lb 12.8 oz (82 kg)   SpO2 97%   BMI 26.70 kg/m  General:  Well developed, well nourished, in no apparent distress Lungs:  CTAB, no access msc use Cardio:  RRR, no bruits, no LE edema Psych: Age appropriate judgment and insight  Assessment:   Type 2 diabetes mellitus with hyperglycemia, without long-term current use of insulin (HCC) - Plan: Hemoglobin A1c, Comprehensive metabolic panel with GFR, Lipid panel, CANCELED: Comprehensive metabolic panel with GFR, CANCELED: Lipid panel, CANCELED: Hemoglobin A1c  Essential hypertension - Plan: lisinopril  (ZESTRIL ) 10 MG tablet  Hyperlipidemia, unspecified hyperlipidemia type  Insomnia, unspecified type - Plan:  amitriptyline (ELAVIL) 10 MG tablet   Plan:   Chronic, hopefully stable. Cont Rybelsus  14 mg/d, metformin  1000 mg bid, Actos  45 mg/d, Farxiga  10 mg/d, glyburide  10 mg bid. Counseled on diet and exercise. Chronic, stable. Cont lisinopril  10 mg/d. Chronic, stable.  Cont Lipitor 20 mg/d.  Chronic, unstable. Stop trazodone . Start Elavil 10 mg/d.  Consider counseling.  Resources were provided in his AVS.  F/u in 1 mo. The patient voiced understanding and agreement to the plan.  Shellie Dials Weston, DO 08/05/23 11:10 AM

## 2023-08-06 ENCOUNTER — Ambulatory Visit: Payer: Self-pay | Admitting: Family Medicine

## 2023-08-06 ENCOUNTER — Other Ambulatory Visit: Payer: Self-pay | Admitting: Family Medicine

## 2023-08-06 DIAGNOSIS — E1165 Type 2 diabetes mellitus with hyperglycemia: Secondary | ICD-10-CM

## 2023-08-06 LAB — COMPREHENSIVE METABOLIC PANEL WITH GFR
AG Ratio: 1.6 (calc) (ref 1.0–2.5)
ALT: 26 U/L (ref 9–46)
AST: 18 U/L (ref 10–35)
Albumin: 4.2 g/dL (ref 3.6–5.1)
Alkaline phosphatase (APISO): 59 U/L (ref 35–144)
BUN: 15 mg/dL (ref 7–25)
CO2: 19 mmol/L — ABNORMAL LOW (ref 20–32)
Calcium: 9.3 mg/dL (ref 8.6–10.3)
Chloride: 106 mmol/L (ref 98–110)
Creat: 0.75 mg/dL (ref 0.70–1.35)
Globulin: 2.6 g/dL (ref 1.9–3.7)
Glucose, Bld: 205 mg/dL — ABNORMAL HIGH (ref 65–99)
Potassium: 4.1 mmol/L (ref 3.5–5.3)
Sodium: 137 mmol/L (ref 135–146)
Total Bilirubin: 0.7 mg/dL (ref 0.2–1.2)
Total Protein: 6.8 g/dL (ref 6.1–8.1)
eGFR: 101 mL/min/{1.73_m2} (ref >=60)

## 2023-08-06 LAB — HEMOGLOBIN A1C
Hgb A1c MFr Bld: 9 % — ABNORMAL HIGH (ref <5.7)
Mean Plasma Glucose: 212 mg/dL
eAG (mmol/L): 11.7 mmol/L

## 2023-08-06 LAB — LIPID PANEL
Cholesterol: 106 mg/dL (ref <200)
HDL: 45 mg/dL (ref >=40)
LDL Cholesterol (Calc): 43 mg/dL
Non-HDL Cholesterol (Calc): 61 mg/dL (ref <130)
Total CHOL/HDL Ratio: 2.4 (calc) (ref <5.0)
Triglycerides: 93 mg/dL (ref <150)

## 2023-08-19 ENCOUNTER — Telehealth: Payer: Self-pay | Admitting: Family Medicine

## 2023-08-19 ENCOUNTER — Other Ambulatory Visit: Payer: Self-pay | Admitting: Family Medicine

## 2023-08-19 NOTE — Telephone Encounter (Signed)
 Copied from CRM [id number]. Topic: Clinical - Medication Refill >> Aug 19, 2023 12:15 PM Mercedes MATSU wrote: Medication: Semaglutide  (RYBELSUS ) 14 MG   Has the patient contacted their pharmacy? Yes (Agent: If no, request that the patient contact the pharmacy for the refill. If patient does not wish to contact the pharmacy document the reason why and proceed with request.) (Agent: If yes, when and what did the pharmacy advise?)  This is the patient's preferred pharmacy:  [facility] [address] [place], [place] - [address] [place] [address] Phone: [phone] Fax: [phone]  Is this the correct pharmacy for this prescription? Yes If no, delete pharmacy and type the correct one.   Has the prescription been filled recently? Yes  Is the patient out of the medication? Yes  Has the patient been seen for an appointment in the last year OR does the patient have an upcoming appointment? Yes  Can we respond through MyChart? No  Agent: Please be advised that Rx refills may take up to 3 business days. We ask that you follow-up with your pharmacy.

## 2023-08-19 NOTE — Telephone Encounter (Signed)
Duplicate, ordered today

## 2023-08-26 ENCOUNTER — Ambulatory Visit: Payer: Self-pay | Admitting: *Deleted

## 2023-08-26 NOTE — Telephone Encounter (Signed)
 Called pt was advised, he needed appt scheduled. Pt advised the note can be discuss In the visit about jury duty.

## 2023-08-26 NOTE — Telephone Encounter (Signed)
 FYI Only or Action Required?: Action required by provider: request for documentation or forms.  Patient was last seen in primary care on 08/05/2023 by Frann Mabel Mt, DO. Called Nurse Triage reporting Depression (Depression, anxiety). Symptoms began about a month ago. Interventions attempted: amitriptyline  (ELAVIL ) 10 MG tablet . Symptoms are: unchanged.  Triage Disposition: See Physician Within 24 Hours  Patient/caregiver understands and will follow disposition?: No, refuses disposition Patient states he was seen 08/05/23 and PCP is aware of his situation. Patient requesting letter of excuse for jury duty- wants to pick up ASAP  Reason for Disposition  [1] Depression AND [2] worsening (e.g., sleeping poorly, less able to do activities of daily living)  Answer Assessment - Initial Assessment Questions 1. CONCERN: What happened that made you call today?     Patient is having some depression- he is not physically or mentally able to serve jury duty 2. DEPRESSION SYMPTOM SCREENING: How are you feeling overall? (e.g., decreased energy, increased sleeping or difficulty sleeping, difficulty concentrating, feelings of sadness, guilt, hopelessness, or worthlessness)     Not sleeping, difficulty concentration, pressure 3. RISK OF HARM - SUICIDAL IDEATION:  Do you ever have thoughts of hurting or killing yourself?  (e.g., yes, no, no but preoccupation with thoughts about death)   - INTENT:  Do you have thoughts of hurting or killing yourself right NOW? (e.g., yes, no, N/A)   - PLAN: Do you have a specific plan for how you would do this? (e.g., gun, knife, overdose, no plan, N/A)     Sometimes patient thinks about it- but does not wants to kill himself and does not have plans 4. RISK OF HARM - HOMICIDAL IDEATION:  Do you ever have thoughts of hurting or killing someone else?  (e.g., yes, no, no but preoccupation with thoughts about death)   - INTENT:  Do you have thoughts of hurting or  killing someone right NOW? (e.g., yes, no, N/A)   - PLAN: Do you have a specific plan for how you would do this? (e.g., gun, knife, no plan, N/A)      no 5. FUNCTIONAL IMPAIRMENT: How have things been going for you overall? Have you had more difficulty than usual doing your normal daily activities?  (e.g., better, same, worse; self-care, school, work, interactions)     Trying to get diabetes under control- exercising  8. STRESSORS: Has there been any new stress or recent changes in your life?     Recent divorce preceding  [employer] duty is August 6- needs note for excuse- patient would like to pick it up as soon as possible  Protocols used: Depression-A-AH   Copied from CRM [id number]. Topic: Clinical - Medical Advice >> Aug 26, 2023  8:48 AM Montie POUR wrote: Reason for CRM:  He would like to discuss his depression, anxiety. He needs an excuse to not have to go to jury duty. He saw Dr. Frann on 08/05/23. Please call him at [phone] He is requesting Dr. Frann to call him.

## 2023-08-27 ENCOUNTER — Encounter: Payer: Self-pay | Admitting: Family Medicine

## 2023-08-27 ENCOUNTER — Ambulatory Visit (INDEPENDENT_AMBULATORY_CARE_PROVIDER_SITE_OTHER): Admitting: Family Medicine

## 2023-08-27 VITALS — BP 126/68 | HR 93 | Temp 98.0°F | Resp 16 | Ht 69.0 in | Wt 173.0 lb

## 2023-08-27 DIAGNOSIS — G4700 Insomnia, unspecified: Secondary | ICD-10-CM | POA: Insufficient documentation

## 2023-08-27 MED ORDER — AMITRIPTYLINE HCL 25 MG PO TABS: 25.0000 mg | ORAL_TABLET | Freq: Every evening | ORAL | 2 refills | Status: AC | PRN

## 2023-08-27 NOTE — Patient Instructions (Addendum)
 Aim to do some physical exertion for 150 minutes per week. This is typically divided into 5 days per week, 30 minutes per day. The activity should be enough to get your heart rate up. Anything is better than nothing if you have time constraints.  Send me a message in 2 weeks if not improving.   Please contact the endocrinology team at [phone]   Let us  know if you need anything.

## 2023-08-27 NOTE — Progress Notes (Signed)
 Chief Complaint  Patient presents with   Medication Refill    Medication Refill    Subjective: Patient is a 63 y.o. male here for insomnia.  Started on Elavil  10-20 mg qhs.  It did make him drowsy and help him sleep but took some time.  No adverse effects.  He is going through divorce and has lots of stress.  Mind is racing.  He failed trazodone .  He is not seeing a therapist or counselor.  Past Medical History:  Diagnosis Date   Allergy    Diabetes mellitus without complication (HCC)    GERD (gastroesophageal reflux disease)    Hyperlipidemia     Objective: BP 126/68 (BP Location: Left Arm, Patient Position: Sitting)   Pulse 93   Temp 98 F (36.7 C) (Oral)   Resp 16   Ht 5' 9 (1.753 m)   Wt 173 lb (78.5 kg)   SpO2 97%   BMI 25.55 kg/m  General: Awake, appears stated age Lungs: No accessory muscle use Psych: Age appropriate judgment and insight, normal affect and mood  Assessment and Plan: Insomnia, unspecified type - Plan: amitriptyline  (ELAVIL ) 25 MG tablet  Chronic, not stable. Increase Elavil  to 25-50 mg qhs prn. Counseled on exercise. F/u in 1 mo to reck.  The patient voiced understanding and agreement to the plan.  Mabel Mt [place], DO 08/27/23  4:56 PM

## 2023-10-12 ENCOUNTER — Other Ambulatory Visit: Payer: Self-pay | Admitting: Family Medicine

## 2023-10-12 DIAGNOSIS — E1165 Type 2 diabetes mellitus with hyperglycemia: Secondary | ICD-10-CM

## 2023-10-16 ENCOUNTER — Other Ambulatory Visit: Payer: Self-pay | Admitting: Family Medicine

## 2023-10-16 MED ORDER — ONETOUCH VERIO VI STRP: ORAL_STRIP | 12 refills | Status: AC

## 2023-10-16 NOTE — Telephone Encounter (Signed)
 Copied from CRM #[id number]. Topic: Clinical - Medication Refill >> Oct 16, 2023  2:18 PM Sasha M wrote: Medication: glucose blood (ONETOUCH VERIO) test strip  Has the patient contacted their pharmacy? Yes (Agent: If no, request that the patient contact the pharmacy for the refill. If patient does not wish to contact the pharmacy document the reason why and proceed with request.) (Agent: If yes, when and what did the pharmacy advise?) Script expired  This is the patient's preferred pharmacy:  [facility] [address] [place], [place] - [address] [place] [address] Phone: [phone] Fax: [phone]  Is this the correct pharmacy for this prescription? Yes If no, delete pharmacy and type the correct one.   Has the prescription been filled recently? No  Is the patient out of the medication? Yes  Has the patient been seen for an appointment in the last year OR does the patient have an upcoming appointment? Yes  Can we respond through MyChart? No  Agent: Please be advised that Rx refills may take up to 3 business days. We ask that you follow-up with your pharmacy.

## 2023-10-22 ENCOUNTER — Other Ambulatory Visit: Payer: Self-pay | Admitting: Family Medicine

## 2023-10-22 DIAGNOSIS — E1165 Type 2 diabetes mellitus with hyperglycemia: Secondary | ICD-10-CM

## 2023-11-04 ENCOUNTER — Other Ambulatory Visit: Payer: Self-pay | Admitting: Family Medicine

## 2023-11-04 DIAGNOSIS — E785 Hyperlipidemia, unspecified: Secondary | ICD-10-CM

## 2023-11-11 ENCOUNTER — Other Ambulatory Visit: Payer: Self-pay | Admitting: Family Medicine

## 2023-11-11 DIAGNOSIS — E1165 Type 2 diabetes mellitus with hyperglycemia: Secondary | ICD-10-CM

## 2023-11-13 ENCOUNTER — Other Ambulatory Visit: Payer: Self-pay | Admitting: Family Medicine

## 2024-01-07 ENCOUNTER — Other Ambulatory Visit: Payer: Self-pay | Admitting: Family Medicine

## 2024-01-07 DIAGNOSIS — E1165 Type 2 diabetes mellitus with hyperglycemia: Secondary | ICD-10-CM

## 2024-01-18 ENCOUNTER — Other Ambulatory Visit: Payer: Self-pay | Admitting: Family Medicine

## 2024-01-18 DIAGNOSIS — E1165 Type 2 diabetes mellitus with hyperglycemia: Secondary | ICD-10-CM

## 2024-01-20 ENCOUNTER — Ambulatory Visit: Payer: Self-pay | Admitting: *Deleted

## 2024-01-20 NOTE — Telephone Encounter (Signed)
 FYI Only or Action Required?: Action required by provider: update on patient condition.  Patient was last seen in primary care on 08/27/2023 by Frann Mabel Mt, DO.  Called Nurse Triage reporting Dizziness (Dizziness, confusion) and Hyperglycemia.  Symptoms began several weeks ago.  Interventions attempted: Prescription medications: Patient reports taking medication as prescribed.  Symptoms are: unchanged.  Triage Disposition: Be seen within 4 hours  Patient/caregiver understands and will follow disposition?: No, refuses disposition  Copied from CRM [id number]. Topic: Clinical - Red Word Triage >> Jan 20, 2024 11:04 AM Berneda FALCON wrote: Red Word that prompted transfer to Nurse Triage: Wife Therisa on the line stating patient is needing to schedule a normal follow up for his diabetic management, but states he has been experiencing dizziness and confusion the past couple of weeks. Reason for Disposition  [1] MILD dizziness (e.g., walking normally) AND [2] has NOT been evaluated by doctor (or NP/PA) for this  (Exception: Dizziness caused by heat exposure, sudden standing, or poor fluid intake.)  [1] Acting confused (e.g., disoriented, slurred speech) AND [2] brief (now gone)  Answer Assessment - Initial Assessment Questions 1. BLOOD GLUCOSE: What is your blood glucose level?      Fasting- 150- 180, dinner time - 120, 174- now 2. ONSET: When did you check the blood glucose?     Trending for couple weeks 3. USUAL RANGE: What is your glucose level usually? (e.g., usual fasting morning value, usual evening value)     Never checked 4. KETONES: Do you check for ketones (urine or blood test strips)? If Yes, ask: What does the test show now?      na 5. TYPE 1 or 2:  Do you know what type of diabetes you have?  (e.g., Type 1, Type 2, Gestational; doesn't know)      Type 2 6. INSULIN: Do you take insulin? What type of insulin(s) do you use? What is the mode of delivery? (syringe,  pen; injection or pump)?      no 7. DIABETES PILLS: Do you take any pills for your diabetes? If Yes, ask: Have you missed taking any pills recently?     Yes- Actos , Metformin ,glyburide , Rybelsus , farxiga  8. OTHER SYMPTOMS: Do you have any symptoms? (e.g., fever, frequent urination, difficulty breathing, dizziness, weakness, vomiting)     Dizziness, confusion- off/on- can't think straight  Answer Assessment - Initial Assessment Questions 1. DESCRIPTION: Describe your dizziness.     tilting 2. LIGHTHEADED: Do you feel lightheaded? (e.g., somewhat faint, woozy, weak upon standing)     weak 3. VERTIGO: Do you feel like either you or the room is spinning or tilting? (i.e., vertigo)     yes 4. SEVERITY: How bad is it?  Do you feel like you are going to faint? Can you stand and walk?     yes 5. ONSET:  When did the dizziness begin?     2 weeks- comes and goes 6. AGGRAVATING FACTORS: Does anything make it worse? (e.g., standing, change in head position)     Bending, fast movement 7. HEART RATE: Can you tell me your heart rate? How many beats in 15 seconds?  (Note: Not all patients can do this.)       unsure 8. CAUSE: What do you think is causing the dizziness? (e.g., decreased fluids or food, diarrhea, emotional distress, heat exposure, new medicine, sudden standing, vomiting; unknown)     Diabetes  9. RECURRENT SYMPTOM: Have you had dizziness before? If Yes, ask: When was the  last time? What happened that time?     no 10. OTHER SYMPTOMS: Do you have any other symptoms? (e.g., fever, chest pain, vomiting, diarrhea, bleeding)       Confusion- comes and goes- lasts couple minutes  Answer Assessment - Initial Assessment Questions 1. LEVEL OF CONSCIOUSNESS: How are they (the patient) acting right now? (e.g., alert-oriented, confused, lethargic, stuporous, comatose)     Alert/oriented 2. ONSET: When did the confusion start?  (e.g., minutes, hours, days)      Several weeks 3. PATTERN: Does this come and go, or has it been constant since it started?  Is it present now?     Comes and goes- patient states unable to keep train of thought  6. CAUSE: What do you think is causing the confusion?      Diabetes- fluctuating  7. OTHER SYMPTOMS: Are there any other symptoms? (e.g., difficulty breathing, fever, headache, weakness)     Dizziness/vertigo  Protocols used: Diabetes - High Blood Sugar-A-AH, Dizziness - Lightheadedness-A-AH, Confusion - Delirium-A-AH

## 2024-01-20 NOTE — Telephone Encounter (Signed)
 Copied from CRM [id number]. Topic: Clinical - Refused Triage >> Jan 20, 2024 11:24 AM Jeoffrey H wrote: Therisa called to make an appt for the patient. In a previous call she called and stated patient was complaining of dizziness. She hung up due to the wait time of 20 minutes.I did ask if patient was having the same symptoms and she stated yes but just wants to schedule him for a regular follow up visit did not want to speak to nurses. Declined transfer to triage.

## 2024-01-21 ENCOUNTER — Ambulatory Visit: Payer: Self-pay | Admitting: Family Medicine

## 2024-01-21 ENCOUNTER — Encounter: Payer: Self-pay | Admitting: Family Medicine

## 2024-01-21 ENCOUNTER — Ambulatory Visit: Payer: Medicare (Managed Care) | Admitting: Family Medicine

## 2024-01-21 VITALS — BP 128/70 | HR 81 | Temp 98.0°F | Resp 16 | Ht 69.0 in | Wt 171.8 lb

## 2024-01-21 DIAGNOSIS — I10 Essential (primary) hypertension: Secondary | ICD-10-CM

## 2024-01-21 DIAGNOSIS — H9192 Unspecified hearing loss, left ear: Secondary | ICD-10-CM | POA: Diagnosis not present

## 2024-01-21 DIAGNOSIS — E785 Hyperlipidemia, unspecified: Secondary | ICD-10-CM

## 2024-01-21 DIAGNOSIS — E1165 Type 2 diabetes mellitus with hyperglycemia: Secondary | ICD-10-CM

## 2024-01-21 DIAGNOSIS — F17210 Nicotine dependence, cigarettes, uncomplicated: Secondary | ICD-10-CM | POA: Diagnosis not present

## 2024-01-21 LAB — COMPREHENSIVE METABOLIC PANEL WITH GFR
ALT: 30 U/L (ref 0–53)
AST: 20 U/L (ref 0–37)
Albumin: 4.7 g/dL (ref 3.5–5.2)
Alkaline Phosphatase: 59 U/L (ref 39–117)
BUN: 25 mg/dL — ABNORMAL HIGH (ref 6–23)
CO2: 31 meq/L (ref 19–32)
Calcium: 9.9 mg/dL (ref 8.4–10.5)
Chloride: 100 meq/L (ref 96–112)
Creatinine, Ser: 0.68 mg/dL (ref 0.40–1.50)
GFR: 97.67 mL/min (ref >60.00)
Glucose, Bld: 149 mg/dL — ABNORMAL HIGH (ref 70–99)
Potassium: 4.4 meq/L (ref 3.5–5.1)
Sodium: 137 meq/L (ref 135–145)
Total Bilirubin: 0.8 mg/dL (ref 0.2–1.2)
Total Protein: 7.1 g/dL (ref 6.0–8.3)

## 2024-01-21 LAB — LIPID PANEL
Cholesterol: 127 mg/dL (ref 0–200)
HDL: 51.8 mg/dL (ref >39.00)
LDL Cholesterol: 62 mg/dL (ref 0–99)
NonHDL: 75.5
Total CHOL/HDL Ratio: 2
Triglycerides: 69 mg/dL (ref 0.0–149.0)
VLDL: 13.8 mg/dL (ref 0.0–40.0)

## 2024-01-21 LAB — HEMOGLOBIN A1C: Hgb A1c MFr Bld: 6.7 % — ABNORMAL HIGH (ref 4.6–6.5)

## 2024-01-21 LAB — MICROALBUMIN / CREATININE URINE RATIO
Creatinine,U: 59.7 mg/dL
Microalb Creat Ratio: UNDETERMINED mg/g (ref 0.0–30.0)
Microalb, Ur: <0.7 mg/dL

## 2024-01-21 NOTE — Progress Notes (Signed)
 Subjective:   Chief Complaint  Patient presents with   Diabetes    Diabetes     Dennis Stanton is a 63 y.o. male here for follow-up of diabetes.   Clif[facility]'s self monitored glucose range is 90-mid 100's.  Patient denies hypoglycemic reactions. He checks his glucose levels 2 time(s) per day. Patient does not require insulin.   Medications include: Rybelsus  14 mg/d, Actos  45 mg/d, glyburide  10 mg/d, metformin  1000 mg BID, Farxiga  10 mg/d Diet is healthy.  Exercise: walking  Hypertension Patient presents for hypertension follow up. He does not monitor home blood pressures. He is compliant with medication- lisinopril  10 mg/d. Patient has these side effects of medication: none Diet/exercise as above. No Cp or SOB.   Hyperlipidemia Patient presents for hyperlipidemia follow up. Currently being treated with Lipitor 20 mg/d and compliance with treatment thus far has been good. He denies myalgias. Diet/exercise as above.  The patient is not known to have coexisting coronary artery disease.  Patient reports some decreased hearing in his left ear.  Requesting an audiology evaluation.  He continues to smoke daily.  He is due for his lung cancer screening scan.  Past Medical History:  Diagnosis Date   Allergy    Diabetes mellitus without complication (HCC)    GERD (gastroesophageal reflux disease)    Hyperlipidemia      Related testing: Retinal exam: Done Pneumovax: done  Objective:  BP 128/70 (BP Location: Left Arm, Patient Position: Sitting)   Pulse 81   Temp 98 F (36.7 C) (Oral)   Resp 16   Ht 5' 9 (1.753 m)   Wt 171 lb 12.8 oz (77.9 kg)   SpO2 98%   BMI 25.37 kg/m  General:  Well developed, well nourished, in no apparent distress Skin:  Warm, no pallor or diaphoresis Head:  Normocephalic, atraumatic Eyes:  Pupils equal and round, sclera anicteric without injection  Lungs:  CTAB, no access msc use Cardio:  RRR, no bruits, no LE edema Musculoskeletal:   Symmetrical muscle groups noted without atrophy or deformity Neuro:  Sensation intact to pinprick on feet Psych: Age appropriate judgment and insight  Assessment:   Type 2 diabetes mellitus with hyperglycemia, without long-term current use of insulin (HCC) - Plan: Comprehensive metabolic panel with GFR, Lipid panel, Hemoglobin A1c, Microalbumin / creatinine urine ratio  Essential hypertension  Hyperlipidemia, unspecified hyperlipidemia type  Hearing loss of left ear, unspecified hearing loss type - Plan: Ambulatory referral to Audiology  Smokes less than 1 pack a day with greater than 20 pack year history - Plan: CT CHEST LUNG CANCER SCREENING LOW DOSE WO CONTRAST   Plan:   Chronic, unstable. Did not sched a endocrinology due to family health issues.  For now he will continue metformin  1000 mg daily, Farxiga  10 mg daily, Rybelsus  14 mg daily, glyburide  10 mg daily, Actos  45 mg daily.  If still not controlled, will consider endocrinology again versus basal insulin.  Counseled on diet and exercise. Chronic, stable.  Continue lisinopril  10 mg daily. Chronic, stable.  Continue atorvastatin  20 mg daily. Refer to audiology at his convenience. Set up low-dose lung cancer screening again. F/u in 3-6 mo. The patient voiced understanding and agreement to the plan.  Mabel Mt [place], DO 01/21/24 11:55 AM

## 2024-01-21 NOTE — Patient Instructions (Addendum)
Give us 2-3 business days to get the results of your labs back.   Keep the diet clean and stay active.  If you do not hear anything about your referrals in the next 1-2 weeks, call our office and ask for an update.  Let us know if you need anything.  

## 2024-01-31 ENCOUNTER — Other Ambulatory Visit: Payer: Self-pay | Admitting: Family Medicine

## 2024-01-31 DIAGNOSIS — E785 Hyperlipidemia, unspecified: Secondary | ICD-10-CM

## 2024-02-08 ENCOUNTER — Other Ambulatory Visit: Payer: Self-pay | Admitting: Family Medicine

## 2024-02-18 ENCOUNTER — Telehealth: Payer: Self-pay | Admitting: Pharmacist

## 2024-02-18 DIAGNOSIS — E1165 Type 2 diabetes mellitus with hyperglycemia: Secondary | ICD-10-CM

## 2024-02-18 MED ORDER — METFORMIN HCL 1000 MG PO TABS: 1000.0000 mg | ORAL_TABLET | Freq: Two times a day (BID) | ORAL | 1 refills | Status: AC

## 2024-02-18 MED ORDER — PIOGLITAZONE HCL 45 MG PO TABS: 45.0000 mg | ORAL_TABLET | Freq: Every day | ORAL | 1 refills | Status: AC

## 2024-02-18 NOTE — Progress Notes (Signed)
 Pharmacy Quality Measure Review  This patient is appearing on a report for being at risk of failing the adherence measure for hypertension (ACEi/ARB) medications this calendar year.   Medication: lisinopril  Last fill date: 10/30/2023 for 90 day supply  Reviewed recent refill history in Dr Annemarie database. Actual last refill date was 01/27/2024 for 90 day supply. Patient has no refills remaining. Next appointment with PCP is not yet scheduled but was recommended to follow up in 6 months after last visit 01/21/2024. Tried to reach patient but was not able to reach him thru his home or cell numbers in the chart. I did LM on his wife's VM to have him call office to make follow up appointment [phone].   Updated prescriptions for metformin  and pioglitazone   Meds ordered this encounter  Medications   pioglitazone  (ACTOS ) 45 MG tablet    Sig: Take 1 tablet (45 mg total) by mouth daily.    Dispense:  90 tablet    Refill:  1   metFORMIN  (GLUCOPHAGE ) 1000 MG tablet    Sig: Take 1 tablet (1,000 mg total) by mouth 2 (two) times daily with a meal.    Dispense:  180 tablet    Refill:  1    Madelin Ray, PharmD Clinical Pharmacist [facility]  Population Health [phone]

## 2024-07-27 ENCOUNTER — Ambulatory Visit: Payer: Medicare (Managed Care) | Admitting: Family Medicine
